# Patient Record
Sex: Female | Born: 1975 | Race: Black or African American | Hispanic: No | Marital: Single | Smoking: Current every day smoker
Health system: Southern US, Community
[De-identification: ages and names within clinical notes are randomized; demographics above are authoritative.]

## PROBLEM LIST (undated history)

## (undated) DIAGNOSIS — I10 Essential (primary) hypertension: Secondary | ICD-10-CM

## (undated) DIAGNOSIS — G43909 Migraine, unspecified, not intractable, without status migrainosus: Secondary | ICD-10-CM

## (undated) HISTORY — PX: LEG SURGERY: SHX1003

---
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
## 2006-01-02 ENCOUNTER — Emergency Department: Payer: Self-pay | Admitting: Emergency Medicine

## 2006-11-02 ENCOUNTER — Emergency Department: Payer: Self-pay | Admitting: Emergency Medicine

## 2007-03-15 ENCOUNTER — Emergency Department: Payer: Self-pay | Admitting: Emergency Medicine

## 2008-01-14 ENCOUNTER — Emergency Department: Payer: Self-pay | Admitting: Emergency Medicine

## 2008-03-06 ENCOUNTER — Emergency Department: Payer: Self-pay | Admitting: Emergency Medicine

## 2010-03-27 ENCOUNTER — Emergency Department: Payer: Self-pay | Admitting: Emergency Medicine

## 2010-08-06 ENCOUNTER — Emergency Department: Payer: Self-pay | Admitting: Emergency Medicine

## 2010-10-21 ENCOUNTER — Emergency Department: Payer: Self-pay | Admitting: Internal Medicine

## 2011-03-05 ENCOUNTER — Emergency Department: Payer: Self-pay | Admitting: Emergency Medicine

## 2011-12-30 ENCOUNTER — Emergency Department: Payer: Self-pay | Admitting: Emergency Medicine

## 2012-01-15 ENCOUNTER — Emergency Department: Payer: Self-pay | Admitting: *Deleted

## 2012-06-22 ENCOUNTER — Emergency Department: Payer: Self-pay | Admitting: Emergency Medicine

## 2012-11-19 ENCOUNTER — Emergency Department: Payer: Self-pay | Admitting: Emergency Medicine

## 2013-05-30 ENCOUNTER — Emergency Department: Payer: Self-pay | Admitting: Emergency Medicine

## 2013-10-24 ENCOUNTER — Emergency Department: Payer: Self-pay | Admitting: Emergency Medicine

## 2013-10-24 LAB — URINALYSIS, COMPLETE
Bacteria: NONE SEEN
Bilirubin,UR: NEGATIVE
Blood: NEGATIVE
GLUCOSE, UR: NEGATIVE mg/dL (ref 0–75)
Leukocyte Esterase: NEGATIVE
NITRITE: NEGATIVE
Ph: 5 (ref 4.5–8.0)
RBC,UR: <1 /HPF (ref 0–5)
Specific Gravity: 1.031 (ref 1.003–1.030)
Squamous Epithelial: 25
WBC UR: 1 /HPF (ref 0–5)

## 2013-10-24 LAB — CBC WITH DIFFERENTIAL/PLATELET
BASOS ABS: 0.1 10*3/uL (ref 0.0–0.1)
Basophil %: 0.5 %
Eosinophil #: 0.1 10*3/uL (ref 0.0–0.7)
Eosinophil %: 1 %
HCT: 39.5 % (ref 35.0–47.0)
HGB: 12.6 g/dL (ref 12.0–16.0)
LYMPHS ABS: 2.7 10*3/uL (ref 1.0–3.6)
LYMPHS PCT: 27.6 %
MCH: 27.6 pg (ref 26.0–34.0)
MCHC: 32.1 g/dL (ref 32.0–36.0)
MCV: 86 fL (ref 80–100)
Monocyte #: 0.7 x10 3/mm (ref 0.2–0.9)
Monocyte %: 7.2 %
NEUTROS ABS: 6.3 10*3/uL (ref 1.4–6.5)
NEUTROS PCT: 63.7 %
Platelet: 242 10*3/uL (ref 150–440)
RBC: 4.58 10*6/uL (ref 3.80–5.20)
RDW: 14.3 % (ref 11.5–14.5)
WBC: 9.9 10*3/uL (ref 3.6–11.0)

## 2013-10-24 LAB — BASIC METABOLIC PANEL
Anion Gap: 1 — ABNORMAL LOW (ref 7–16)
BUN: 12 mg/dL (ref 7–18)
CO2: 30 mmol/L (ref 21–32)
CREATININE: 0.81 mg/dL (ref 0.60–1.30)
Calcium, Total: 8.9 mg/dL (ref 8.5–10.1)
Chloride: 108 mmol/L — ABNORMAL HIGH (ref 98–107)
EGFR (African American): >60
EGFR (Non-African Amer.): >60
GLUCOSE: 75 mg/dL (ref 65–99)
Osmolality: 276 (ref 275–301)
Potassium: 3.5 mmol/L (ref 3.5–5.1)
Sodium: 139 mmol/L (ref 136–145)

## 2013-10-31 ENCOUNTER — Emergency Department: Payer: Self-pay | Admitting: Emergency Medicine

## 2013-10-31 LAB — COMPREHENSIVE METABOLIC PANEL
ANION GAP: 3 — AB (ref 7–16)
AST: 15 U/L (ref 15–37)
Albumin: 3.2 g/dL — ABNORMAL LOW (ref 3.4–5.0)
Alkaline Phosphatase: 107 U/L
BUN: 15 mg/dL (ref 7–18)
Bilirubin,Total: 0.2 mg/dL (ref 0.2–1.0)
CALCIUM: 8 mg/dL — AB (ref 8.5–10.1)
Chloride: 111 mmol/L — ABNORMAL HIGH (ref 98–107)
Co2: 26 mmol/L (ref 21–32)
Creatinine: 0.97 mg/dL (ref 0.60–1.30)
EGFR (African American): >60
EGFR (Non-African Amer.): >60
GLUCOSE: 71 mg/dL (ref 65–99)
Osmolality: 279 (ref 275–301)
POTASSIUM: 3.7 mmol/L (ref 3.5–5.1)
SGPT (ALT): 15 U/L (ref 12–78)
SODIUM: 140 mmol/L (ref 136–145)
Total Protein: 6.8 g/dL (ref 6.4–8.2)

## 2013-10-31 LAB — CBC
HCT: 36.7 % (ref 35.0–47.0)
HGB: 12.4 g/dL (ref 12.0–16.0)
MCH: 28.9 pg (ref 26.0–34.0)
MCHC: 33.8 g/dL (ref 32.0–36.0)
MCV: 86 fL (ref 80–100)
PLATELETS: 194 10*3/uL (ref 150–440)
RBC: 4.29 10*6/uL (ref 3.80–5.20)
RDW: 14.1 % (ref 11.5–14.5)
WBC: 9.4 10*3/uL (ref 3.6–11.0)

## 2013-11-07 ENCOUNTER — Emergency Department: Payer: Self-pay | Admitting: Emergency Medicine

## 2013-11-22 ENCOUNTER — Emergency Department: Payer: Self-pay | Admitting: Emergency Medicine

## 2013-12-01 ENCOUNTER — Emergency Department: Payer: Self-pay | Admitting: Emergency Medicine

## 2013-12-01 LAB — BASIC METABOLIC PANEL
Anion Gap: 3 — ABNORMAL LOW (ref 7–16)
BUN: 11 mg/dL (ref 7–18)
Calcium, Total: 8.2 mg/dL — ABNORMAL LOW (ref 8.5–10.1)
Chloride: 108 mmol/L — ABNORMAL HIGH (ref 98–107)
Co2: 25 mmol/L (ref 21–32)
Creatinine: 0.79 mg/dL (ref 0.60–1.30)
EGFR (Non-African Amer.): >60
GLUCOSE: 145 mg/dL — AB (ref 65–99)
OSMOLALITY: 274 (ref 275–301)
Potassium: 3.5 mmol/L (ref 3.5–5.1)
SODIUM: 136 mmol/L (ref 136–145)

## 2013-12-01 LAB — CBC
HCT: 39.5 % (ref 35.0–47.0)
HGB: 12.9 g/dL (ref 12.0–16.0)
MCH: 28.1 pg (ref 26.0–34.0)
MCHC: 32.7 g/dL (ref 32.0–36.0)
MCV: 86 fL (ref 80–100)
PLATELETS: 209 10*3/uL (ref 150–440)
RBC: 4.6 10*6/uL (ref 3.80–5.20)
RDW: 14.1 % (ref 11.5–14.5)
WBC: 8.5 10*3/uL (ref 3.6–11.0)

## 2013-12-01 LAB — TROPONIN I: Troponin-I: <0.02 ng/mL

## 2014-04-28 ENCOUNTER — Emergency Department: Payer: Self-pay | Admitting: Student

## 2014-05-22 ENCOUNTER — Emergency Department: Payer: Self-pay | Admitting: Emergency Medicine

## 2014-07-15 IMAGING — CR DG CHEST 2V
1 series · 2 of 2 positions shown · non-contrast
Comparison: None.

CLINICAL DATA: Mid chest pain, left arm numbness and shortness of
breath

EXAM:
CHEST  2 VIEW

[Series 1: w chest pa · 0.14mm/px · 2 of 2 slices shown]
[im 1/2]
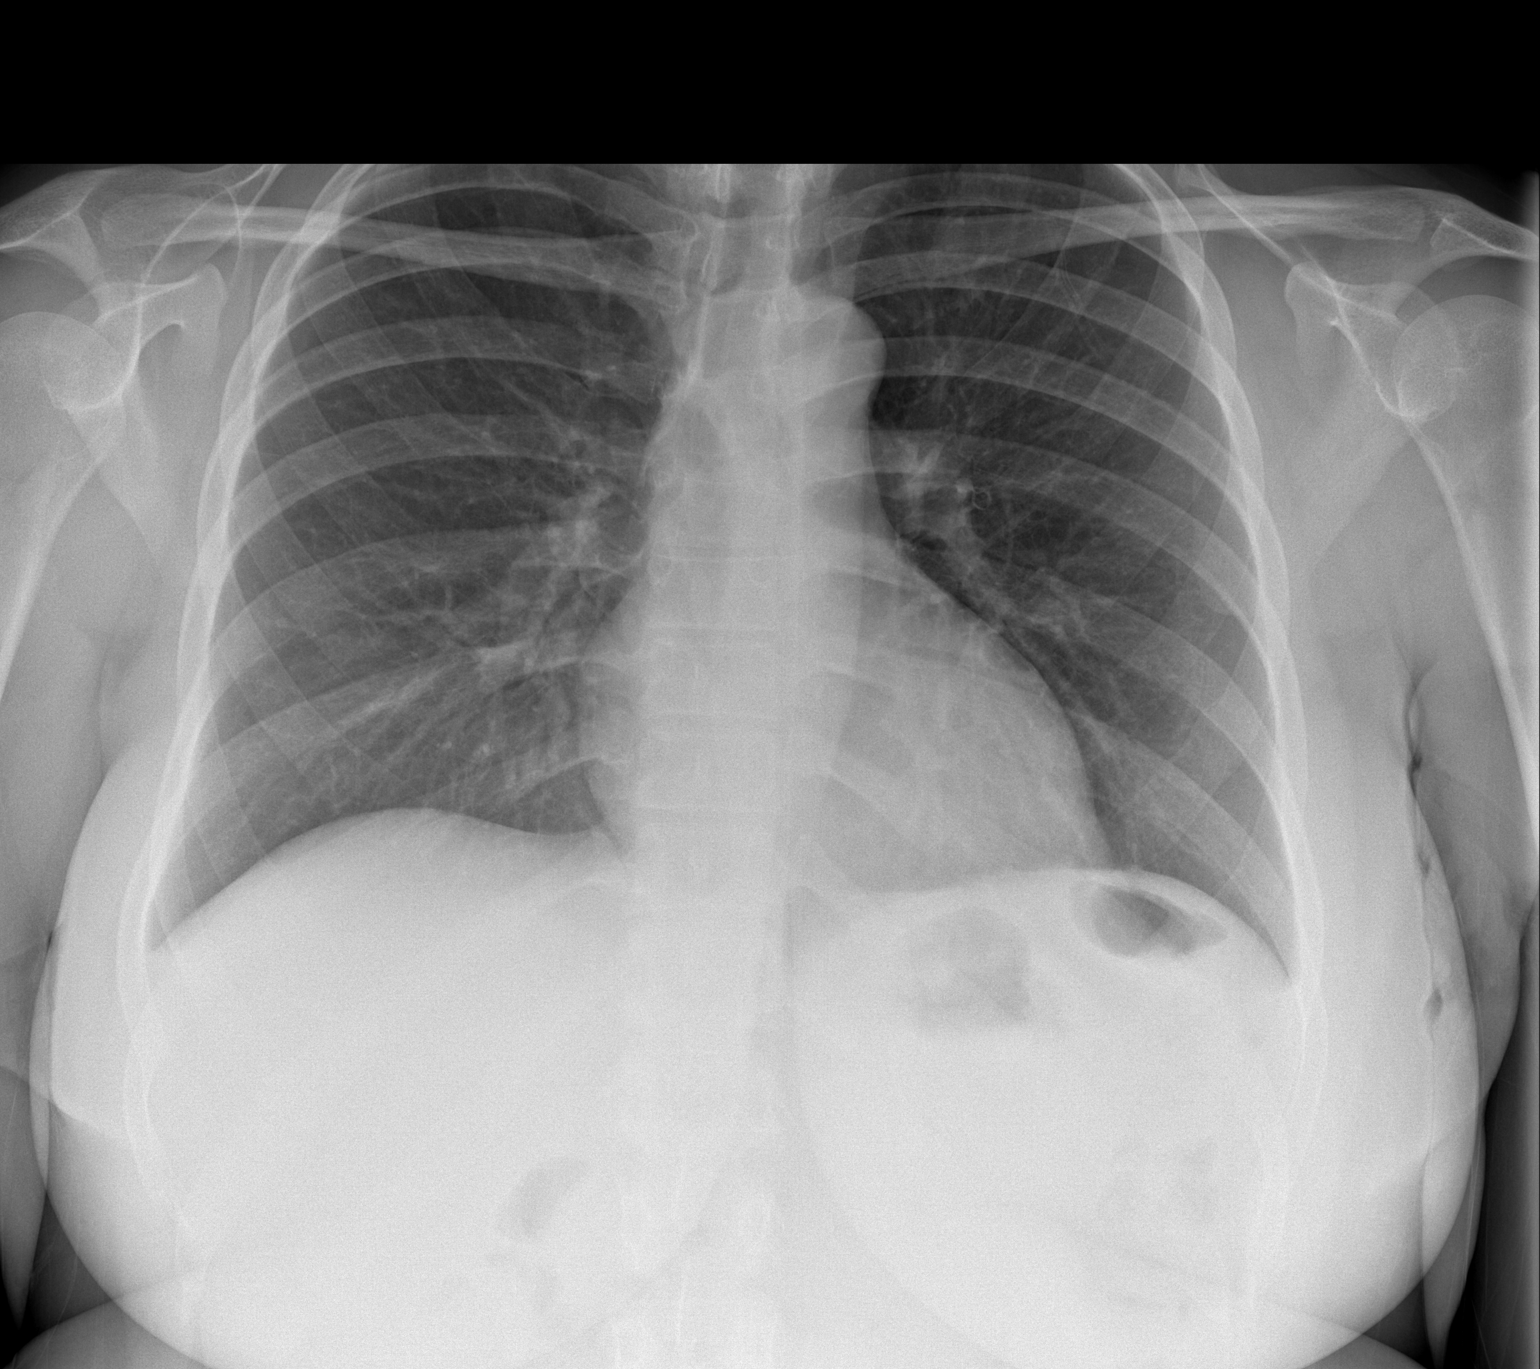
[im 2/2]
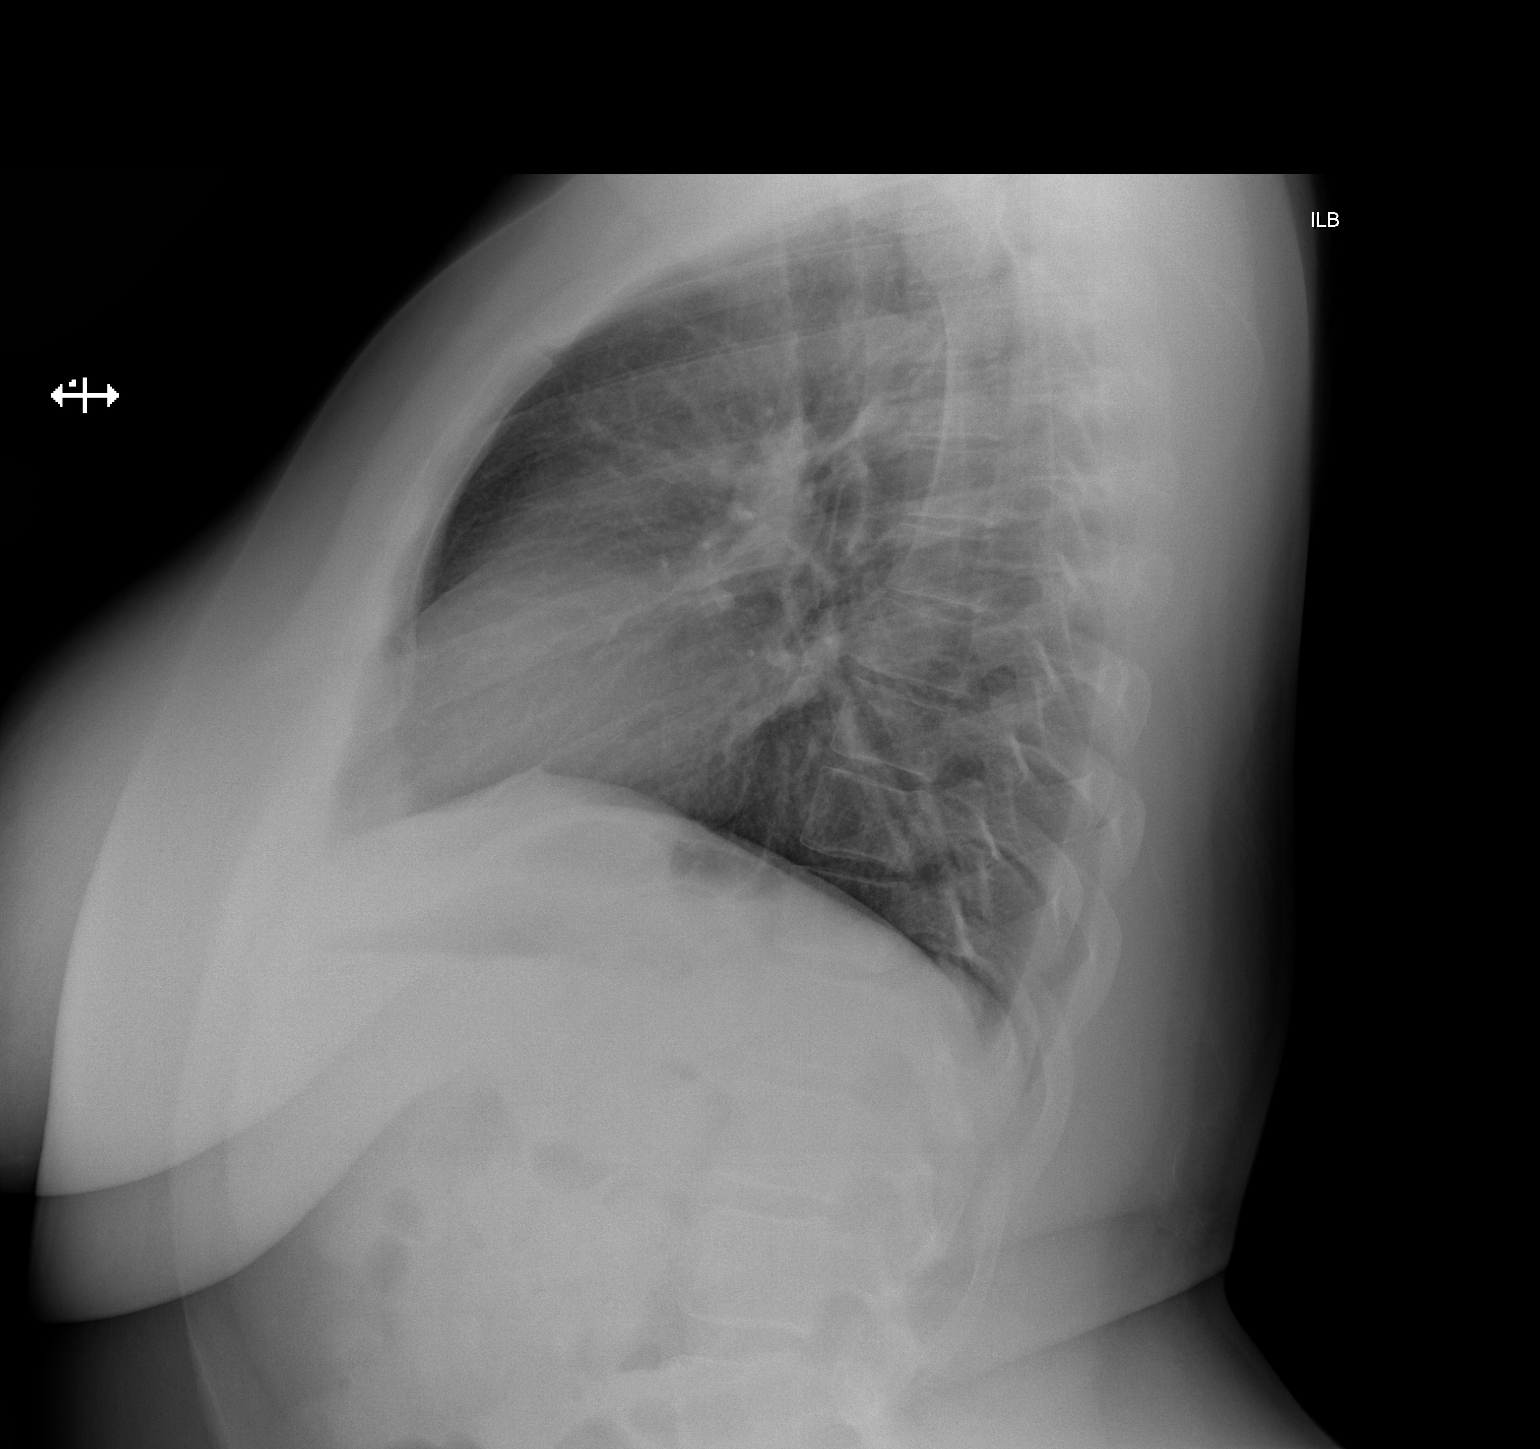

[2 of 2 positions shown; findings below may reference images not displayed]

FINDINGS: The heart size and mediastinal contours are within normal limits.
Both lungs are clear. The visualized skeletal structures are
unremarkable.
IMPRESSION: No active cardiopulmonary disease.

## 2014-11-07 ENCOUNTER — Emergency Department: Payer: Self-pay | Admitting: Emergency Medicine

## 2014-12-15 ENCOUNTER — Emergency Department
Admit: 2014-12-15 | Disposition: A | Discharge status: Home / Self Care | Payer: Self-pay | Admitting: Emergency Medicine

## 2014-12-15 LAB — COMPREHENSIVE METABOLIC PANEL
Albumin: 4.1 g/dL
Alkaline Phosphatase: 98 U/L
Anion Gap: 4 — ABNORMAL LOW (ref 7–16)
BUN: 9 mg/dL
Bilirubin,Total: 0.6 mg/dL
Calcium, Total: 8.8 mg/dL — ABNORMAL LOW
Chloride: 109 mmol/L
Co2: 25 mmol/L
Creatinine: 0.85 mg/dL
EGFR (African American): >60
EGFR (Non-African Amer.): >60
Glucose: 84 mg/dL
Potassium: 3.6 mmol/L
SGOT(AST): 16 U/L
SGPT (ALT): 13 U/L — ABNORMAL LOW
Sodium: 138 mmol/L
Total Protein: 7.5 g/dL

## 2014-12-15 LAB — CBC WITH DIFFERENTIAL/PLATELET
BASOS ABS: 0.1 10*3/uL (ref 0.0–0.1)
Basophil %: 0.6 %
EOS PCT: 0.8 %
Eosinophil #: 0.1 10*3/uL (ref 0.0–0.7)
HCT: 39.1 % (ref 35.0–47.0)
HGB: 13 g/dL (ref 12.0–16.0)
Lymphocyte #: 2.5 10*3/uL (ref 1.0–3.6)
Lymphocyte %: 23.2 %
MCH: 28.4 pg (ref 26.0–34.0)
MCHC: 33.4 g/dL (ref 32.0–36.0)
MCV: 85 fL (ref 80–100)
MONO ABS: 0.8 x10 3/mm (ref 0.2–0.9)
MONOS PCT: 7.7 %
NEUTROS PCT: 67.7 %
Neutrophil #: 7.4 10*3/uL — ABNORMAL HIGH (ref 1.4–6.5)
PLATELETS: 222 10*3/uL (ref 150–440)
RBC: 4.59 10*6/uL (ref 3.80–5.20)
RDW: 14.7 % — ABNORMAL HIGH (ref 11.5–14.5)
WBC: 10.9 10*3/uL (ref 3.6–11.0)

## 2014-12-15 LAB — URINALYSIS, COMPLETE
BILIRUBIN, UR: NEGATIVE
Blood: NEGATIVE
GLUCOSE, UR: NEGATIVE mg/dL (ref 0–75)
Leukocyte Esterase: NEGATIVE
Nitrite: POSITIVE
Ph: 5 (ref 4.5–8.0)
Protein: 30
SPECIFIC GRAVITY: 1.024 (ref 1.003–1.030)

## 2014-12-15 LAB — LIPASE, BLOOD: Lipase: 29 U/L

## 2015-01-19 ENCOUNTER — Emergency Department: Payer: No Typology Code available for payment source

## 2015-01-19 ENCOUNTER — Encounter: Payer: Self-pay | Admitting: Emergency Medicine

## 2015-01-19 DIAGNOSIS — Y9389 Activity, other specified: Secondary | ICD-10-CM | POA: Insufficient documentation

## 2015-01-19 DIAGNOSIS — S0083XA Contusion of other part of head, initial encounter: Secondary | ICD-10-CM | POA: Diagnosis not present

## 2015-01-19 DIAGNOSIS — I10 Essential (primary) hypertension: Secondary | ICD-10-CM | POA: Insufficient documentation

## 2015-01-19 DIAGNOSIS — Y9289 Other specified places as the place of occurrence of the external cause: Secondary | ICD-10-CM | POA: Diagnosis not present

## 2015-01-19 DIAGNOSIS — Z720 Tobacco use: Secondary | ICD-10-CM | POA: Insufficient documentation

## 2015-01-19 DIAGNOSIS — S8992XA Unspecified injury of left lower leg, initial encounter: Secondary | ICD-10-CM | POA: Insufficient documentation

## 2015-01-19 DIAGNOSIS — S60211A Contusion of right wrist, initial encounter: Secondary | ICD-10-CM | POA: Insufficient documentation

## 2015-01-19 DIAGNOSIS — S0990XA Unspecified injury of head, initial encounter: Secondary | ICD-10-CM | POA: Diagnosis present

## 2015-01-19 DIAGNOSIS — Y998 Other external cause status: Secondary | ICD-10-CM | POA: Insufficient documentation

## 2015-01-19 DIAGNOSIS — W01198A Fall on same level from slipping, tripping and stumbling with subsequent striking against other object, initial encounter: Secondary | ICD-10-CM | POA: Insufficient documentation

## 2015-01-19 DIAGNOSIS — Z880 Allergy status to penicillin: Secondary | ICD-10-CM | POA: Insufficient documentation

## 2015-01-19 MED ORDER — OXYCODONE-ACETAMINOPHEN 5-325 MG PO TABS
1.0000 | ORAL_TABLET | Freq: Once | ORAL | Status: AC
  Administered 2015-01-19: 1 via ORAL

## 2015-01-19 MED ORDER — OXYCODONE-ACETAMINOPHEN 5-325 MG PO TABS
ORAL_TABLET | ORAL | Status: AC
  Administered 2015-01-19: 1 via ORAL
  Filled 2015-01-19: qty 1

## 2015-01-19 NOTE — ED Notes (Signed)
Pt presents to ED with c/o pain to head, right arm and hand after falling while getting out of the shower around 1 this afternoon. Pt is tearful during triage.

## 2015-01-20 ENCOUNTER — Emergency Department: Payer: No Typology Code available for payment source

## 2015-01-20 ENCOUNTER — Emergency Department
Admission: EM | Admit: 2015-01-20 | Discharge: 2015-01-20 | Disposition: A | Discharge status: Home / Self Care | Payer: No Typology Code available for payment source | Attending: Emergency Medicine | Admitting: Emergency Medicine

## 2015-01-20 DIAGNOSIS — S60211A Contusion of right wrist, initial encounter: Secondary | ICD-10-CM

## 2015-01-20 DIAGNOSIS — S0990XA Unspecified injury of head, initial encounter: Secondary | ICD-10-CM

## 2015-01-20 HISTORY — DX: Essential (primary) hypertension: I10

## 2015-01-20 HISTORY — DX: Migraine, unspecified, not intractable, without status migrainosus: G43.909

## 2015-01-20 MED ORDER — OXYCODONE-ACETAMINOPHEN 5-325 MG PO TABS
ORAL_TABLET | ORAL | Status: AC
  Administered 2015-01-20: 1 via ORAL
  Filled 2015-01-20: qty 1

## 2015-01-20 MED ORDER — OXYCODONE-ACETAMINOPHEN 5-325 MG PO TABS
1.0000 | ORAL_TABLET | Freq: Once | ORAL | Status: AC
  Administered 2015-01-20: 1 via ORAL

## 2015-01-20 NOTE — Discharge Instructions (Signed)
Head Injury  You have a head injury. Headaches and throwing up (vomiting) are common after a head injury. It should be easy to wake up from sleeping. Sometimes you must stay in the hospital. Most problems happen within the first 24 hours. Side effects may occur up to 7-10 days after the injury.   WHAT ARE THE TYPES OF HEAD INJURIES?  Head injuries can be as minor as a bump. Some head injuries can be more severe. More severe head injuries include:  · A jarring injury to the brain (concussion).  · A bruise of the brain (contusion). This mean there is bleeding in the brain that can cause swelling.  · A cracked skull (skull fracture).  · Bleeding in the brain that collects, clots, and forms a bump (hematoma).  WHEN SHOULD I GET HELP RIGHT AWAY?   · You are confused or sleepy.  · You cannot be woken up.  · You feel sick to your stomach (nauseous) or keep throwing up (vomiting).  · Your dizziness or unsteadiness is getting worse.  · You have very bad, lasting headaches that are not helped by medicine. Take medicines only as told by your doctor.  · You cannot use your arms or legs like normal.  · You cannot walk.  · You notice changes in the black spots in the center of the colored part of your eye (pupil).  · You have clear or bloody fluid coming from your nose or ears.  · You have trouble seeing.  During the next 24 hours after the injury, you must stay with someone who can watch you. This person should get help right away (call 911 in the [place] if you start to shake and are not able to control it (have seizures), you pass out, or you are unable to wake up.  HOW CAN I PREVENT A HEAD INJURY IN THE FUTURE?  · Wear seat belts.  · Wear a helmet while bike riding and playing sports like football.  · Stay away from dangerous activities around the house.  WHEN CAN I RETURN TO NORMAL ACTIVITIES AND ATHLETICS?  See your doctor before doing these activities. You should not do normal activities or play contact sports until 1 week  after the following symptoms have stopped:  · Headache that does not go away.  · Dizziness.  · Poor attention.  · Confusion.  · Memory problems.  · Sickness to your stomach or throwing up.  · Tiredness.  · Fussiness.  · Bothered by bright lights or loud noises.  · Anxiousness or depression.  · Restless sleep.  MAKE SURE YOU:   · Understand these instructions.  · Will watch your condition.  · Will get help right away if you are not doing well or get worse.  Document Released: 07/24/2008 Document Revised: 12/26/2013 Document Reviewed: 04/18/2013  ExitCare® Patient Information ©2015 [employer]. This information is not intended to replace advice given to you by your health care provider. Make sure you discuss any questions you have with your health care provider.

## 2015-01-20 NOTE — ED Provider Notes (Signed)
[facility] Emergency Department Provider Note  ____________________________________________  Time seen: Approximately 2:20 AM  I have reviewed the triage vital signs and the nursing notes.   HISTORY  Chief Complaint Fall    HPI Molly Fernandez is a 39 y.o. female presents after falling while getting out of the shower at 1 PM yesterday. Patient notes she was getting out of the shower and caught her leg she fell forward and hit the left side of her face as well as left shoulder and right wrist. Since then she has been having a moderate left-sided headache, as well as pain over the left collarbone, and tenderness in the right hand.  She denies any neck pain. She is unsure if she could have lost consciousness, but is unclear. She did not have a seizure. She denies any chest pain or shortness of breath. She states that she simply tripped, and has been having pain and soreness as described above. She is not pregnant.   Past Medical History  Diagnosis Date  . Hypertension   . Migraine     There are no active problems to display for this patient.   Past Surgical History  Procedure Laterality Date  . Leg surgery Right     No current outpatient prescriptions on file.  Allergies Penicillins  No family history on file.  Social History History  Substance Use Topics  . Smoking status: Current Every Day Smoker  . Smokeless tobacco: Not on file  . Alcohol Use: No    Review of Systems Constitutional: No fever/chills Eyes: No visual changes. ENT: No sore throat. Cardiovascular: Denies chest pain. Respiratory: Denies shortness of breath. Gastrointestinal: No abdominal pain.  No nausea, no vomiting.  No diarrhea.  No constipation. Genitourinary: Negative for dysuria. Musculoskeletal: Negative for back pain. Skin: Negative for rash. Neurological: See history of present illness. No focal weakness or numbness  10-point ROS otherwise  negative.  ____________________________________________   PHYSICAL EXAM:  VITAL SIGNS: ED Triage Vitals  Enc Vitals Group     BP 01/19/15 2016 169/125 mmHg     Pulse Rate 01/19/15 2016 91     Resp 01/19/15 2016 18     Temp 01/19/15 2016 98.3 F (36.8 C)     Temp Source 01/19/15 2016 Oral     SpO2 01/19/15 2016 100 %     Weight 01/19/15 2016 215 lb (97.523 kg)     Height 01/19/15 2016  (1.6 m)     Head Cir --      Peak Flow --      Pain Score 01/19/15 2018 10     Pain Loc --      Pain Edu? --      Excl. in GC? --     Constitutional: Alert and oriented. Well appearing and in no acute distress. Eyes: Conjunctivae are normal. PERRL. EOMI. Head: Atraumatic except for a small amount of hematoma over the left fourth head. Nose: No congestion/rhinnorhea. Mouth/Throat: Mucous membranes are moist.  Oropharynx non-erythematous. Neck: No stridor.  No cervical spine tenderness to palpation. Cardiovascular: Normal rate, regular rhythm. Grossly normal heart sounds.  Good peripheral circulation. Respiratory: Normal respiratory effort.  No retractions. Lungs CTAB. Gastrointestinal: Soft and nontender. No distention. No abdominal bruits. No CVA tenderness. Musculoskeletal: No lower extremity tenderness nor edema.  No joint effusions. There is mild tenderness over the right lateral wrist, without evidence of effusion or deformity. There is also minimal tenderness over the right fifth metacarpal with very  minimal contusion, but no deformity. Median ulnar and radial nerves are neurosensory intact. Normal circulation to the right hand. Left upper extremity is atraumatic, there is minimal tenderness over the left collarbone but no obvious step-off or deformity. Remainder of the chest is nontender. Neurologic:  Normal speech and language. No gross focal neurologic deficits are appreciated. Speech is normal. No gait instability. Skin:  Skin is warm, dry and intact. No rash noted. Psychiatric: Mood  and affect are normal. Speech and behavior are normal.  ____________________________________________   LABS (all labs ordered are listed, but only abnormal results are displayed)  Labs Reviewed - No data to display ____________________________________________  EKG  DG Chest 2 View (Final result) Result time: 01/20/15 01:47:54   Final result by Rad Results In Interface (01/20/15 01:47:54)   Narrative:   CLINICAL DATA: Status post fall in shower, with left anterior upper chest pain. Initial encounter.  EXAM: CHEST 2 VIEW  COMPARISON: Chest radiograph from 12/01/2013  FINDINGS: The lungs are well-aerated and clear. There is no evidence of focal opacification, pleural effusion or pneumothorax.  The heart is normal in size; the mediastinal contour is within normal limits. No acute osseous abnormalities are seen.  IMPRESSION: No acute cardiopulmonary process seen. No displaced rib fractures identified.   Electronically Signed By: Roanna Raider M.D. On: 01/20/2015 01:47          DG Hand Complete Right (Final result) Result time: 01/20/15 01:48:46   Final result by Rad Results In Interface (01/20/15 01:48:46)   Narrative:   CLINICAL DATA: Status post fall in shower, with right hand pain, particularly about the fifth metacarpal. Initial encounter.  EXAM: RIGHT HAND - COMPLETE 3+ VIEW  COMPARISON: Right hand radiographs performed 03/15/2007  FINDINGS: There is no evidence of fracture or dislocation. The joint spaces are preserved. The carpal rows are intact, and demonstrate normal alignment. The soft tissues are unremarkable in appearance.  IMPRESSION: No evidence of fracture or dislocation.   Electronically Signed By: Roanna Raider M.D. On: 01/20/2015 01:48          CT Head Wo Contrast (Final result) Result time: 01/19/15 20:59:02   Final result by Rad Results In Interface (01/19/15 20:59:02)   Narrative:   CLINICAL DATA: Pt  presents to ED with c/o pain to head, right arm and hand after falling while getting out of the shower around 1 this afternoon.  EXAM: CT HEAD WITHOUT CONTRAST  TECHNIQUE: Contiguous axial images were obtained from the base of the skull through the vertex without intravenous contrast.  COMPARISON: 11/19/2012  FINDINGS: Ventricles normal in size and configuration.  No parenchymal masses or mass effect. No evidence of an infarct.  There are no extra-axial masses or abnormal fluid collections.  There is no intracranial hemorrhage.  No skull fracture. Visualized sinuses and mastoid air cells are clear.  IMPRESSION: Normal unenhanced CT scan of the brain.   Electronically Signed By: Amie Portland M.D. On: 01/19/2015 20:59          DG Wrist Complete Right (Final result) Result time: 01/19/15 20:56:42   Final result by Rad Results In Interface (01/19/15 20:56:42)   Narrative:   CLINICAL DATA: C/o pain to medial Rt wrist up to 5th metacarpal after falling out of shower today. Pt states no prior hx of issues  EXAM: RIGHT WRIST - COMPLETE 3+ VIEW  COMPARISON: None.  FINDINGS: There is no evidence of fracture or dislocation. There is no evidence of arthropathy or other focal bone abnormality. Soft tissues are  unremarkable.  IMPRESSION: Negative.   Electronically Signed By: Amie Portlandavid Ormond M.D. On: 01/19/2015 20:56    ____________________________________________   PROCEDURES  Procedure(s) performed: None  Critical Care performed: No  ____________________________________________   INITIAL IMPRESSION / ASSESSMENT AND PLAN / ED COURSE  Pertinent labs & imaging results that were available during my care of the patient were reviewed by me and considered in my medical decision making (see chart for details).  Mechanical fall. Multiple injuries, however none appear to be of emergent concern. She does have evidence of a small hematoma on the  forehead as well as contusions over the right wrist and hand.  We will treat the patient with NSAID medications for pain over-the-counter and advised follow-up with her PCP.  Return precautions, specifically head injury instructions discussed with the patient. Her friend is driving her home. ____________________________________________   FINAL CLINICAL IMPRESSION(S) / ED DIAGNOSES  Final diagnoses:  Closed head injury, initial encounter  Contusion of right wrist, initial encounter       Sharyn CreamerMark Callaghan Laverdure, MD 01/25/15 [phone]

## 2015-02-19 ENCOUNTER — Encounter: Payer: Self-pay | Admitting: General Practice

## 2015-02-19 ENCOUNTER — Emergency Department
Admission: EM | Admit: 2015-02-19 | Discharge: 2015-02-19 | Disposition: A | Discharge status: Home / Self Care | Payer: No Typology Code available for payment source | Attending: Emergency Medicine | Admitting: Emergency Medicine

## 2015-02-19 DIAGNOSIS — Z720 Tobacco use: Secondary | ICD-10-CM | POA: Insufficient documentation

## 2015-02-19 DIAGNOSIS — Y9389 Activity, other specified: Secondary | ICD-10-CM | POA: Insufficient documentation

## 2015-02-19 DIAGNOSIS — W01198A Fall on same level from slipping, tripping and stumbling with subsequent striking against other object, initial encounter: Secondary | ICD-10-CM | POA: Diagnosis not present

## 2015-02-19 DIAGNOSIS — R51 Headache: Secondary | ICD-10-CM | POA: Diagnosis not present

## 2015-02-19 DIAGNOSIS — I10 Essential (primary) hypertension: Secondary | ICD-10-CM | POA: Insufficient documentation

## 2015-02-19 DIAGNOSIS — Y998 Other external cause status: Secondary | ICD-10-CM | POA: Insufficient documentation

## 2015-02-19 DIAGNOSIS — R519 Headache, unspecified: Secondary | ICD-10-CM

## 2015-02-19 DIAGNOSIS — G8929 Other chronic pain: Secondary | ICD-10-CM | POA: Diagnosis not present

## 2015-02-19 DIAGNOSIS — S0990XA Unspecified injury of head, initial encounter: Secondary | ICD-10-CM | POA: Diagnosis present

## 2015-02-19 DIAGNOSIS — Y9289 Other specified places as the place of occurrence of the external cause: Secondary | ICD-10-CM | POA: Diagnosis not present

## 2015-02-19 MED ORDER — KETOROLAC TROMETHAMINE 30 MG/ML IJ SOLN
INTRAMUSCULAR | Status: AC
  Filled 2015-02-19: qty 1

## 2015-02-19 MED ORDER — SODIUM CHLORIDE 0.9 % IV SOLN
Freq: Once | INTRAVENOUS | Status: AC
  Administered 2015-02-19: 22:00:00 via INTRAVENOUS

## 2015-02-19 MED ORDER — KETOROLAC TROMETHAMINE 30 MG/ML IJ SOLN
30.0000 mg | Freq: Once | INTRAMUSCULAR | Status: AC
  Administered 2015-02-19: 30 mg via INTRAVENOUS

## 2015-02-19 MED ORDER — METOCLOPRAMIDE HCL 10 MG PO TABS: 10.0000 mg | ORAL_TABLET | Freq: Three times a day (TID) | ORAL | Status: DC | PRN

## 2015-02-19 MED ORDER — DIPHENHYDRAMINE HCL 50 MG/ML IJ SOLN
25.0000 mg | Freq: Once | INTRAMUSCULAR | Status: AC
  Administered 2015-02-19: 25 mg via INTRAVENOUS

## 2015-02-19 MED ORDER — DIPHENHYDRAMINE HCL 50 MG/ML IJ SOLN
INTRAMUSCULAR | Status: AC
  Filled 2015-02-19: qty 1

## 2015-02-19 MED ORDER — METOCLOPRAMIDE HCL 5 MG/ML IJ SOLN
10.0000 mg | Freq: Once | INTRAMUSCULAR | Status: AC
  Administered 2015-02-19: 10 mg via INTRAVENOUS
  Filled 2015-02-19: qty 2

## 2015-02-19 NOTE — Discharge Instructions (Signed)

## 2015-02-19 NOTE — ED Notes (Signed)
Pt. Arrived to ed from home with reports of experiencing headache over the last few weeks. Pt verbalized hx of migraines, but states "i fell and hit my head on the tiolet two weeks ago and i have had this headache since". PT alert and oriented. Denies LOC. Pt verbalized hx of HTN. Pt states "i mean i usually have headaches but they usually start at the base of my head not right here" as pt points to left forehead.

## 2015-02-19 NOTE — ED Provider Notes (Signed)
[facility] Emergency Department Provider Note     Time seen: ----------------------------------------- 8:38 PM on 02/19/2015 -----------------------------------------    I have reviewed the triage vital signs and the nursing notes.   HISTORY  Chief Complaint Headache    HPI Molly Fernandez is a 39 y.o. female who presents ER for worsening headache intermittently over the last month. Patient states she doesn't history of migraines who states she fell and hit her head on the toilet 2 weeks ago and she's had significant headache since. Initial trauma was to the left frontal scalp, headache is across the frontal scalp bilaterally. Denies any trouble with light or sound. Patient states she used to take Maxalt, but couldn't afford to take it anymore. She typically just takes Excedrin Migraine for headaches, but these aren't really helping that much.Headache is severe this time.   Past Medical History  Diagnosis Date  . Hypertension   . Migraine     There are no active problems to display for this patient.   Past Surgical History  Procedure Laterality Date  . Leg surgery Right     Allergies Penicillins  Social History History  Substance Use Topics  . Smoking status: Current Every Day Smoker -- 0.50 packs/day    Types: Cigarettes  . Smokeless tobacco: Not on file  . Alcohol Use: No    Review of Systems Constitutional: Negative for fever. Eyes: Negative for visual changes. ENT: Negative for sore throat. Cardiovascular: Negative for chest pain. Respiratory: Negative for shortness of breath. Gastrointestinal: Negative for abdominal pain, vomiting and diarrhea. Genitourinary: Negative for dysuria. Musculoskeletal: Negative for back pain. Skin: Negative for rash. Neurological: Positive for headache  10-point ROS otherwise negative.  ____________________________________________   PHYSICAL EXAM:  VITAL SIGNS: ED Triage Vitals  Enc  Vitals Group     BP 02/19/15 1838 171/101 mmHg     Pulse Rate 02/19/15 1836 73     Resp 02/19/15 1836 20     Temp 02/19/15 1836 98.3 F (36.8 C)     Temp Source 02/19/15 1836 Oral     SpO2 02/19/15 1836 98 %     Weight 02/19/15 1836 200 lb (90.719 kg)     Height 02/19/15 1836 5\' 3"  (1.6 m)     Head Cir --      Peak Flow --      Pain Score 02/19/15 1842 8     Pain Loc --      Pain Edu? --      Excl. in GC? --     Constitutional: Alert and oriented. Well appearing and in no distress. Eyes: Conjunctivae are normal. PERRL. Normal extraocular movements. ENT   Head: Normocephalic and atraumatic.   Nose: No congestion/rhinnorhea.   Mouth/Throat: Mucous membranes are moist.   Neck: No stridor. Hematological/Lymphatic/Immunilogical: No cervical lymphadenopathy. Cardiovascular: Normal rate, regular rhythm. Normal and symmetric distal pulses are present in all extremities. No murmurs, rubs, or gallops. Respiratory: Normal respiratory effort without tachypnea nor retractions. Breath sounds are clear and equal bilaterally. No wheezes/rales/rhonchi. Gastrointestinal: Soft and nontender. No distention. No abdominal bruits. There is no CVA tenderness. Musculoskeletal: Nontender with normal range of motion in all extremities. No joint effusions.  No lower extremity tenderness nor edema. Neurologic:  Normal speech and language. No gross focal neurologic deficits are appreciated. Speech is normal. No gait instability. Skin:  Skin is warm, dry and intact. No rash noted. Psychiatric: Mood and affect are normal. Speech and behavior are normal. Patient exhibits appropriate  insight and judgment. ____________________________________________  ED COURSE:  Pertinent labs & imaging results that were available during my care of the patient were reviewed by me and considered in my medical decision making (see chart for details). Patient is in no acute distress, will receive IV fluids, IV Reglan,  Toradol, Benadryl. We will reevaluate after that point ____________________________________________  FINAL ASSESSMENT AND PLAN  Headache generalized, unknown cause  Plan: Patient notes she's been under increased stress. Doubt the head trauma has anything to with her headache tonight. She is feeling better after the IV medications dictated above, stable for outpatient follow-up.   Emily Filbert, MD   Emily Filbert, MD 02/20/15 [phone]

## 2015-02-26 ENCOUNTER — Encounter: Payer: Self-pay | Admitting: Emergency Medicine

## 2015-02-26 ENCOUNTER — Other Ambulatory Visit: Payer: Self-pay

## 2015-02-26 ENCOUNTER — Emergency Department
Admission: EM | Admit: 2015-02-26 | Discharge: 2015-02-26 | Disposition: A | Discharge status: Home / Self Care | Payer: No Typology Code available for payment source | Attending: Emergency Medicine | Admitting: Emergency Medicine

## 2015-02-26 DIAGNOSIS — Z79899 Other long term (current) drug therapy: Secondary | ICD-10-CM | POA: Insufficient documentation

## 2015-02-26 DIAGNOSIS — Z8669 Personal history of other diseases of the nervous system and sense organs: Secondary | ICD-10-CM | POA: Insufficient documentation

## 2015-02-26 DIAGNOSIS — Z720 Tobacco use: Secondary | ICD-10-CM | POA: Insufficient documentation

## 2015-02-26 DIAGNOSIS — R55 Syncope and collapse: Secondary | ICD-10-CM | POA: Diagnosis not present

## 2015-02-26 DIAGNOSIS — Z3202 Encounter for pregnancy test, result negative: Secondary | ICD-10-CM | POA: Insufficient documentation

## 2015-02-26 DIAGNOSIS — R51 Headache: Secondary | ICD-10-CM | POA: Diagnosis not present

## 2015-02-26 DIAGNOSIS — I10 Essential (primary) hypertension: Secondary | ICD-10-CM | POA: Diagnosis not present

## 2015-02-26 LAB — BASIC METABOLIC PANEL
ANION GAP: 7 (ref 5–15)
BUN: 11 mg/dL (ref 6–20)
CALCIUM: 8.8 mg/dL — AB (ref 8.9–10.3)
CHLORIDE: 104 mmol/L (ref 101–111)
CO2: 26 mmol/L (ref 22–32)
CREATININE: 0.98 mg/dL (ref 0.44–1.00)
GFR calc Af Amer: >60 mL/min (ref >60)
GFR calc non Af Amer: >60 mL/min (ref >60)
Glucose, Bld: 99 mg/dL (ref 65–99)
Potassium: 3.1 mmol/L — ABNORMAL LOW (ref 3.5–5.1)
Sodium: 137 mmol/L (ref 135–145)

## 2015-02-26 LAB — URINALYSIS COMPLETE WITH MICROSCOPIC (ARMC ONLY)
BACTERIA UA: NONE SEEN
BILIRUBIN URINE: NEGATIVE
Glucose, UA: NEGATIVE mg/dL
Hgb urine dipstick: NEGATIVE
KETONES UR: NEGATIVE mg/dL
Leukocytes, UA: NEGATIVE
Nitrite: NEGATIVE
PH: 5 (ref 5.0–8.0)
PROTEIN: NEGATIVE mg/dL
Specific Gravity, Urine: 1.016 (ref 1.005–1.030)

## 2015-02-26 LAB — CBC
HEMATOCRIT: 38.3 % (ref 35.0–47.0)
HEMOGLOBIN: 12.4 g/dL (ref 12.0–16.0)
MCH: 27.6 pg (ref 26.0–34.0)
MCHC: 32.3 g/dL (ref 32.0–36.0)
MCV: 85.4 fL (ref 80.0–100.0)
Platelets: 220 10*3/uL (ref 150–440)
RBC: 4.48 MIL/uL (ref 3.80–5.20)
RDW: 14.2 % (ref 11.5–14.5)
WBC: 9.1 10*3/uL (ref 3.6–11.0)

## 2015-02-26 LAB — SEDIMENTATION RATE: Sed Rate: 14 mm/hr (ref 0–20)

## 2015-02-26 LAB — POCT PREGNANCY, URINE: PREG TEST UR: NEGATIVE

## 2015-02-26 MED ORDER — TRAMADOL HCL 50 MG PO TABS
100.0000 mg | ORAL_TABLET | Freq: Once | ORAL | Status: AC
  Administered 2015-02-26: 100 mg via ORAL

## 2015-02-26 MED ORDER — TRAMADOL HCL 50 MG PO TABS
ORAL_TABLET | ORAL | Status: AC
  Administered 2015-02-26: 100 mg via ORAL
  Filled 2015-02-26: qty 2

## 2015-02-26 NOTE — Discharge Instructions (Signed)
Near-Syncope Near-syncope (commonly known as near fainting) is sudden weakness, dizziness, or feeling like you might pass out. During an episode of near-syncope, you may also develop pale skin, have tunnel vision, or feel sick to your stomach (nauseous). Near-syncope may occur when getting up after sitting or while standing for a long time. It is caused by a sudden decrease in blood flow to the brain. This decrease can result from various causes or triggers, most of which are not serious. However, because near-syncope can sometimes be a sign of something serious, a medical evaluation is required. The specific cause is often not determined. HOME CARE INSTRUCTIONS  Monitor your condition for any changes. The following actions may help to alleviate any discomfort you are experiencing:  Have someone stay with you until you feel stable.  Lie down right away and prop your feet up if you start feeling like you might faint. Breathe deeply and steadily. Wait until all the symptoms have passed. Most of these episodes last only a few minutes. You may feel tired for several hours.   Drink enough fluids to keep your urine clear or pale yellow.   If you are taking blood pressure or heart medicine, get up slowly when seated or lying down. Take several minutes to sit and then stand. This can reduce dizziness.  Follow up with your health care provider as directed. SEEK IMMEDIATE MEDICAL CARE IF:   You have a severe headache.   You have unusual pain in the chest, abdomen, or back.   You are bleeding from the mouth or rectum, or you have black or tarry stool.   You have an irregular or very fast heartbeat.   You have repeated fainting or have seizure-like jerking during an episode.   You faint when sitting or lying down.   You have confusion.   You have difficulty walking.   You have severe weakness.   You have vision problems.  MAKE SURE YOU:   Understand these instructions.  Will  watch your condition.  Will get help right away if you are not doing well or get worse. Document Released: 08/11/2005 Document Revised: 08/16/2013 Document Reviewed: 01/14/2013 ExitCare Patient Information 2015 [employer]. This information is not intended to replace advice given to you by your health care provider. Make sure you discuss any questions you have with your health care provider.  

## 2015-02-26 NOTE — ED Notes (Signed)
Pt ambulatory with a steady gait, complaining of headache 9/10 x1 week, dizzy spells every time upon standing.

## 2015-02-26 NOTE — ED Notes (Signed)
Pt states she had a near syncopal episode at work today. States she took her bp before going to work this am 171/120, took her enalapril/hctz, and has felt dizzy/nauseated ever since. Also c/o ha/neck stiffness.

## 2015-02-26 NOTE — ED Provider Notes (Signed)
[facility] Emergency Department Provider Note  Time seen: 2:25 PM  I have reviewed the triage vital signs and the nursing notes.   HISTORY  Chief Complaint Near Syncope    HPI Molly Fernandez is a [age] y.o. female with a past medical history of hypertension and migraines presents the emergency department with weakness/dizziness. According to the patient she woke up around 5:00 this morning, during showering she felt a little weak and dizzy so she took her blood pressure and it was 170/110. She states she got ready for work and went to work around 7:30 began feeling weak and dizzy again along with a mild headache. She states her symptoms progressively worsened throughout the day at times she felt like she was going to pass out so she decided to come to the emergency department for evaluation. Patient denies any chest pain, abdominal pain, shortness of breath, nausea. Patient does state a headache denies any focal weakness or numbness. Patient describes her symptoms as moderate in severity, worse when she was exerting herself.Somewhat better when resting.     Past Medical History  Diagnosis Date  . Hypertension   . Migraine   . Migraine     There are no active problems to display for this patient.   Past Surgical History  Procedure Laterality Date  . Leg surgery Right     Current Outpatient Rx  Name  Route  Sig  Dispense  Refill  . metoCLOPramide (REGLAN) 10 MG tablet   Oral   Take 1 tablet (10 mg total) by mouth every 8 (eight) hours as needed for nausea or vomiting.   30 tablet   1     Allergies Penicillins  No family history on file.  Social History History  Substance Use Topics  . Smoking status: Current Every Day Smoker -- 0.50 packs/day    Types: Cigarettes  . Smokeless tobacco: Not on file  . Alcohol Use: No    Review of Systems Constitutional: Negative for fever. Positive for Near syncope. Cardiovascular: Negative for chest  pain. Respiratory: Negative for shortness of breath. Gastrointestinal: Negative for abdominal pain, vomiting and diarrhea. Genitourinary: Negative for dysuria. Musculoskeletal: Negative for back pain. Neurological: Positive for headache. Negative for focal weakness or numbness. 10-point ROS otherwise negative.  ____________________________________________   PHYSICAL EXAM:  VITAL SIGNS: ED Triage Vitals  Enc Vitals Group     BP 02/26/15 1234 155/96 mmHg     Pulse Rate 02/26/15 1234 90     Resp 02/26/15 1234 18     Temp 02/26/15 1234 98.5 F (36.9 C)     Temp Source 02/26/15 1234 Oral     SpO2 02/26/15 1234 100 %     Weight 02/26/15 1234 185 lb (83.915 kg)     Height 02/26/15 1234 5\' 3"  (1.6 m)     Head Cir --      Peak Flow --      Pain Score 02/26/15 1235 9     Pain Loc --      Pain Edu? --      Excl. in GC? --     Constitutional: Alert and oriented. Well appearing and in no distress. Eyes: Normal exam ENT   Mouth/Throat: Mucous membranes are moist. Cardiovascular: Normal rate, regular rhythm. No murmur Respiratory: Normal respiratory effort without tachypnea nor retractions. Breath sounds are clear and equal bilaterally. No wheezes/rales/rhonchi. Gastrointestinal: Soft and nontender. No distention.   Musculoskeletal: Nontender with normal range of motion in all  extremities. No lower extremity tenderness or edema. Neurologic:  Normal speech and language. No gross focal neurologic deficits are appreciated. Speech is normal. Skin:  Skin is warm, dry and intact.  Psychiatric: Mood and affect are normal. Speech and behavior are normal.  ____________________________________________    EKG  EKG reviewed and interpreted by myself shows sinus arrhythmia at 70 bpm, narrow QRS, normal axis, normal intervals, nonspecific but no concerning ST changes noted.  ____________________________________________    INITIAL IMPRESSION / ASSESSMENT AND PLAN / ED COURSE  Pertinent  labs & imaging results that were available during my care of the patient were reviewed by me and considered in my medical decision making (see chart for details).  Patient with generalized weakness today, and occasional near syncope feeling. Denies any syncope, chest pain, shortness breath, abdominal pain. Labs are largely within normal limits, we will check an EKG, as well as a urinalysis and pregnancy test. Patient states she feels much better at this time.  Labs including urinalysis and pregnancy tests are within normal limits/negative. EKG is within normal limits. We will discharge the patient home with primary care follow-up. I discussed with the patient plenty of by mouth fluids and rest. Patient agreeable to plan.  ____________________________________________   FINAL CLINICAL IMPRESSION(S) / ED DIAGNOSES  Weakness/dizziness Near-syncope   Minna Antis, MD 02/26/15 1601

## 2015-03-24 ENCOUNTER — Emergency Department
Admission: EM | Admit: 2015-03-24 | Discharge: 2015-03-24 | Disposition: A | Discharge status: Home / Self Care | Payer: No Typology Code available for payment source | Attending: Emergency Medicine | Admitting: Emergency Medicine

## 2015-03-24 ENCOUNTER — Encounter: Payer: Self-pay | Admitting: Emergency Medicine

## 2015-03-24 DIAGNOSIS — Z880 Allergy status to penicillin: Secondary | ICD-10-CM | POA: Insufficient documentation

## 2015-03-24 DIAGNOSIS — I10 Essential (primary) hypertension: Secondary | ICD-10-CM | POA: Insufficient documentation

## 2015-03-24 DIAGNOSIS — N760 Acute vaginitis: Secondary | ICD-10-CM | POA: Diagnosis not present

## 2015-03-24 DIAGNOSIS — Z3202 Encounter for pregnancy test, result negative: Secondary | ICD-10-CM | POA: Insufficient documentation

## 2015-03-24 DIAGNOSIS — Z720 Tobacco use: Secondary | ICD-10-CM | POA: Insufficient documentation

## 2015-03-24 DIAGNOSIS — R102 Pelvic and perineal pain: Secondary | ICD-10-CM | POA: Diagnosis present

## 2015-03-24 DIAGNOSIS — B9689 Other specified bacterial agents as the cause of diseases classified elsewhere: Secondary | ICD-10-CM

## 2015-03-24 LAB — URINALYSIS COMPLETE WITH MICROSCOPIC (ARMC ONLY)
Bilirubin Urine: NEGATIVE
GLUCOSE, UA: NEGATIVE mg/dL
Hgb urine dipstick: NEGATIVE
Ketones, ur: NEGATIVE mg/dL
LEUKOCYTES UA: NEGATIVE
Nitrite: NEGATIVE
Protein, ur: NEGATIVE mg/dL
Specific Gravity, Urine: 1.024 (ref 1.005–1.030)
pH: 5 (ref 5.0–8.0)

## 2015-03-24 LAB — CHLAMYDIA/NGC RT PCR (ARMC ONLY)
CHLAMYDIA TR: NOT DETECTED
N GONORRHOEAE: NOT DETECTED

## 2015-03-24 LAB — PREGNANCY, URINE: Preg Test, Ur: NEGATIVE

## 2015-03-24 LAB — WET PREP, GENITAL
Trich, Wet Prep: NONE SEEN
Yeast Wet Prep HPF POC: NONE SEEN

## 2015-03-24 MED ORDER — ONDANSETRON 8 MG PO TBDP: 8.0000 mg | ORAL_TABLET | Freq: Three times a day (TID) | ORAL | Status: DC | PRN

## 2015-03-24 MED ORDER — METRONIDAZOLE 500 MG PO TABS: 500.0000 mg | ORAL_TABLET | Freq: Two times a day (BID) | ORAL | Status: DC

## 2015-03-24 MED ORDER — NITROFURANTOIN MACROCRYSTAL 100 MG PO CAPS: 100.0000 mg | ORAL_CAPSULE | Freq: Two times a day (BID) | ORAL | Status: DC

## 2015-03-24 NOTE — ED Provider Notes (Signed)
[facility] Emergency Department Provider Note  ____________________________________________  Time seen: 5:15 AM  I have reviewed the triage vital signs and the nursing notes.   HISTORY  Chief Complaint Vaginal Discharge    HPI Molly Fernandez is a 39 y.o. female who complains of bilateral pelvic pain for 2 weeks. She notes that her urine is also become very foul smelling. She recently was told she had a urinary tract infection that was very mild, and the patient treated that with only Azo and cranberry juice and did not fill antibiotic prescription she was given for it. She states that it has not gotten better. She denies any flank pain fever chills nausea vomiting chest pain shortness of breath dizziness or syncope.  She has had only a single sexual partner in the last 6 years with him she is still active. She does not use barrier protection always. She has very low suspicion that she could have a sexual transmitted infection. She does note that she has engaged in anal sex recently but is not having any pain with bowel movements or rectal pain or rectal bleeding.     Past Medical History  Diagnosis Date  . Hypertension   . Migraine   . Migraine     There are no active problems to display for this patient.   Past Surgical History  Procedure Laterality Date  . Leg surgery Right     Current Outpatient Rx  Name  Route  Sig  Dispense  Refill  . metoCLOPramide (REGLAN) 10 MG tablet   Oral   Take 1 tablet (10 mg total) by mouth every 8 (eight) hours as needed for nausea or vomiting.   30 tablet   1   . metroNIDAZOLE (FLAGYL) 500 MG tablet   Oral   Take 1 tablet (500 mg total) by mouth 2 (two) times daily.   30 tablet   0   . nitrofurantoin (MACRODANTIN) 100 MG capsule   Oral   Take 1 capsule (100 mg total) by mouth 2 (two) times daily.   6 capsule   0   . ondansetron (ZOFRAN ODT) 8 MG disintegrating tablet   Oral   Take 1 tablet (8 mg  total) by mouth every 8 (eight) hours as needed for nausea or vomiting.   20 tablet   0     Allergies Penicillins  History reviewed. No pertinent family history.  Social History History  Substance Use Topics  . Smoking status: Current Every Day Smoker -- 0.50 packs/day    Types: Cigarettes  . Smokeless tobacco: Not on file  . Alcohol Use: No    Review of Systems  Constitutional: No fever or chills. No weight changes Eyes:No blurry vision or double vision.  ENT: No sore throat. Cardiovascular: No chest pain. Respiratory: No dyspnea or cough. Gastrointestinal: Pelvic pain as above.  No BRBPR or melena. Genitourinary: Dysuria and foul-smelling urine. Musculoskeletal: Negative for back pain. No joint swelling or pain. Skin: Negative for rash. Neurological: Negative for headaches, focal weakness or numbness. Psychiatric:No anxiety or depression.   Endocrine:No hot/cold intolerance, changes in energy, or sleep difficulty.  10-point ROS otherwise negative.  ____________________________________________   PHYSICAL EXAM:  VITAL SIGNS: ED Triage Vitals  Enc Vitals Group     BP 03/24/15 0338 169/109 mmHg     Pulse Rate 03/24/15 0337 89     Resp 03/24/15 0337 20     Temp 03/24/15 0337 98.3 F (36.8 C)  Temp Source 03/24/15 0337 Oral     SpO2 03/24/15 0337 100 %     Weight 03/24/15 0337 201 lb (91.173 kg)     Height 03/24/15 0337  (1.575 m)     Head Cir --      Peak Flow --      Pain Score 03/24/15 0337 8     Pain Loc --      Pain Edu? --      Excl. in GC? --      Constitutional: Alert and oriented. Well appearing and in no distress. Eyes: No scleral icterus. No conjunctival pallor. PERRL. EOMI ENT   Head: Normocephalic and atraumatic.   Nose: No congestion/rhinnorhea. No septal hematoma   Mouth/Throat: MMM, no pharyngeal erythema. No peritonsillar mass. No uvula shift.   Neck: No stridor. No SubQ emphysema. No  meningismus. Hematological/Lymphatic/Immunilogical: No cervical lymphadenopathy. Cardiovascular: RRR. Normal and symmetric distal pulses are present in all extremities. No murmurs, rubs, or gallops. Respiratory: Normal respiratory effort without tachypnea nor retractions. Breath sounds are clear and equal bilaterally. No wheezes/rales/rhonchi. Gastrointestinal: Suprapubic tenderness. No distention. There is no CVA tenderness.  No rebound, rigidity, or guarding. Genitourinary: External exam unremarkable. Speculum exam reveals thick white discharge in the vaginal vault. 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 No bleeding. No adnexal tenderness or masses. Musculoskeletal: Nontender with normal range of motion in all extremities. No joint effusions.  No lower extremity tenderness.  No edema. Neurologic:   Normal speech and language.  CN 2-10 normal. Motor grossly intact. No pronator drift.  Normal gait. No gross focal neurologic deficits are appreciated.  Skin:  Skin is warm, dry and intact. No rash noted.  No petechiae, purpura, or bullae. Psychiatric: Mood and affect are normal. Speech and behavior are normal. Patient exhibits appropriate insight and judgment.  ____________________________________________    LABS (pertinent positives/negatives) (all labs ordered are listed, but only abnormal results are displayed) Labs Reviewed  WET PREP, GENITAL - Abnormal; Notable for the following:    Clue Cells Wet Prep HPF POC FEW (*)    WBC, Wet Prep HPF POC FEW (*)    All other components within normal limits  URINALYSIS COMPLETEWITH MICROSCOPIC ([facility] ONLY) - Abnormal; Notable for the following:    Color, Urine YELLOW (*)    APPearance CLEAR (*)    Bacteria, UA RARE (*)    Squamous Epithelial / LPF 6-30 (*)    All other components within normal limits  CHLAMYDIA/NGC RT PCR ([facility] ONLY)  PREGNANCY, URINE    ____________________________________________   EKG    ____________________________________________    RADIOLOGY    ____________________________________________   PROCEDURES  ____________________________________________   INITIAL IMPRESSION / ASSESSMENT AND PLAN / ED COURSE  Pertinent labs & imaging results that were available during my care of the patient were reviewed by me and considered in my medical decision making (see chart for details).  Urinalysis is unremarkable, however patient has symptoms consistent with cystitis. We'll start her on Macrobid for this. She is also having pelvic pain with some vaginal discharge and clue cells on wet prep so we'll treat her with Flagyl for bacterial vaginosis. No evidence of torsion PID. Low suspicion for STI, we'll await follow-up GC chlamydia PCR and patient will be contacted if these are positive.  ____________________________________________   FINAL CLINICAL IMPRESSION(S) / ED DIAGNOSES  Final diagnoses:  Bacterial vaginosis   cystitis    Aneta Mins  Scotty Court, MD 03/24/15 [phone]

## 2015-03-24 NOTE — ED Notes (Signed)
Patient with complaint of lower abd pain and foul smelling urine times 2 weeks. Patient states that the pain became worse tonight.

## 2015-03-24 NOTE — ED Notes (Signed)
Pt presents to ER alert and in NAD. Pt reports lower abd pain into her groin, intermittently and "strong odor" urination. Pt also states different color vaginal discharge than normal. Pt states it has been going on for several weeks.

## 2015-03-24 NOTE — Discharge Instructions (Signed)
Bacterial Vaginosis Bacterial vaginosis is a vaginal infection that occurs when the normal balance of bacteria in the vagina is disrupted. It results from an overgrowth of certain bacteria. This is the most common vaginal infection in women of childbearing age. Treatment is important to prevent complications, especially in pregnant women, as it can cause a premature delivery. CAUSES  Bacterial vaginosis is caused by an increase in harmful bacteria that are normally present in smaller amounts in the vagina. Several different kinds of bacteria can cause bacterial vaginosis. However, the reason that the condition develops is not fully understood. RISK FACTORS Certain activities or behaviors can put you at an increased risk of developing bacterial vaginosis, including:  Having a new sex partner or multiple sex partners.  Douching.  Using an intrauterine device (IUD) for contraception. Women do not get bacterial vaginosis from toilet seats, bedding, swimming pools, or contact with objects around them. SIGNS AND SYMPTOMS  Some women with bacterial vaginosis have no signs or symptoms. Common symptoms include:  Grey vaginal discharge.  A fishlike odor with discharge, especially after sexual intercourse.  Itching or burning of the vagina and vulva.  Burning or pain with urination. DIAGNOSIS  Your health care provider will take a medical history and examine the vagina for signs of bacterial vaginosis. A sample of vaginal fluid may be taken. Your health care provider will look at this sample under a microscope to check for bacteria and abnormal cells. A vaginal pH test may also be done.  TREATMENT  Bacterial vaginosis may be treated with antibiotic medicines. These may be given in the form of a pill or a vaginal cream. A second round of antibiotics may be prescribed if the condition comes back after treatment.  HOME CARE INSTRUCTIONS   Only take over-the-counter or prescription medicines as  directed by your health care provider.  If antibiotic medicine was prescribed, take it as directed. Make sure you finish it even if you start to feel better.  Do not have sex until treatment is completed.  Tell all sexual partners that you have a vaginal infection. They should see their health care provider and be treated if they have problems, such as a mild rash or itching.  Practice safe sex by using condoms and only having one sex partner. SEEK MEDICAL CARE IF:   Your symptoms are not improving after 3 days of treatment.  You have increased discharge or pain.  You have a fever. MAKE SURE YOU:   Understand these instructions.  Will watch your condition.  Will get help right away if you are not doing well or get worse. FOR MORE INFORMATION  Centers for Disease Control and Prevention, Division of STD Prevention: www.cdc.gov/std American Sexual Health Association (ASHA): www.ashastd.org  Document Released: 08/11/2005 Document Revised: 06/01/2013 Document Reviewed: 03/23/2013 [employer] Patient Information 2015 [employer]. This information is not intended to replace advice given to you by your health care provider. Make sure you discuss any questions you have with your health care provider.  

## 2015-03-29 ENCOUNTER — Emergency Department: Payer: No Typology Code available for payment source

## 2015-03-29 ENCOUNTER — Encounter: Payer: Self-pay | Admitting: Emergency Medicine

## 2015-03-29 ENCOUNTER — Emergency Department
Admission: EM | Admit: 2015-03-29 | Discharge: 2015-03-29 | Disposition: A | Discharge status: Home / Self Care | Payer: No Typology Code available for payment source | Attending: Emergency Medicine | Admitting: Emergency Medicine

## 2015-03-29 DIAGNOSIS — S59911A Unspecified injury of right forearm, initial encounter: Secondary | ICD-10-CM | POA: Diagnosis present

## 2015-03-29 DIAGNOSIS — Y92009 Unspecified place in unspecified non-institutional (private) residence as the place of occurrence of the external cause: Secondary | ICD-10-CM | POA: Insufficient documentation

## 2015-03-29 DIAGNOSIS — W231XXA Caught, crushed, jammed, or pinched between stationary objects, initial encounter: Secondary | ICD-10-CM | POA: Diagnosis not present

## 2015-03-29 DIAGNOSIS — Y998 Other external cause status: Secondary | ICD-10-CM | POA: Insufficient documentation

## 2015-03-29 DIAGNOSIS — I10 Essential (primary) hypertension: Secondary | ICD-10-CM | POA: Diagnosis not present

## 2015-03-29 DIAGNOSIS — S5011XA Contusion of right forearm, initial encounter: Secondary | ICD-10-CM | POA: Insufficient documentation

## 2015-03-29 DIAGNOSIS — Z720 Tobacco use: Secondary | ICD-10-CM | POA: Diagnosis not present

## 2015-03-29 DIAGNOSIS — Y9389 Activity, other specified: Secondary | ICD-10-CM | POA: Insufficient documentation

## 2015-03-29 MED ORDER — IBUPROFEN 800 MG PO TABS: 800.0000 mg | ORAL_TABLET | Freq: Three times a day (TID) | ORAL | Status: DC | PRN

## 2015-03-29 MED ORDER — TRAMADOL HCL 50 MG PO TABS: 50.0000 mg | ORAL_TABLET | Freq: Three times a day (TID) | ORAL | Status: DC | PRN

## 2015-03-29 NOTE — ED Notes (Signed)
Patient to ER for c/o right forearm pain. Patient states she was playing around with her son and arm got shut in door.

## 2015-03-29 NOTE — Discharge Instructions (Signed)
Take medication as prescribed. Apply ice and elevate. Wear splint for 3 days or as long as pain continues.  Follow-up with your primary care physician as needed. Follow-up with orthopedist next week as needed for continued pain. See above to call to schedule. Return to the ER for new or worsening concerns.  Contusion A contusion is a deep bruise. Contusions are the result of an injury that caused bleeding under the skin. The contusion may turn blue, purple, or yellow. Minor injuries will give you a painless contusion, but more severe contusions may stay painful and swollen for a few weeks.  CAUSES  A contusion is usually caused by a blow, trauma, or direct force to an area of the body. SYMPTOMS   Swelling and redness of the injured area.  Bruising of the injured area.  Tenderness and soreness of the injured area.  Pain. DIAGNOSIS  The diagnosis can be made by taking a history and physical exam. An X-ray, CT scan, or MRI may be needed to determine if there were any associated injuries, such as fractures. TREATMENT  Specific treatment will depend on what area of the body was injured. In general, the best treatment for a contusion is resting, icing, elevating, and applying cold compresses to the injured area. Over-the-counter medicines may also be recommended for pain control. Ask your caregiver what the best treatment is for your contusion. HOME CARE INSTRUCTIONS   Put ice on the injured area.  Put ice in a plastic bag.  Place a towel between your skin and the bag.  Leave the ice on for 15-20 minutes, 3-4 times a day, or as directed by your health care provider.  Only take over-the-counter or prescription medicines for pain, discomfort, or fever as directed by your caregiver. Your caregiver may recommend avoiding anti-inflammatory medicines (aspirin, ibuprofen, and naproxen) for 48 hours because these medicines may increase bruising.  Rest the injured area.  If possible, elevate the  injured area to reduce swelling. SEEK IMMEDIATE MEDICAL CARE IF:   You have increased bruising or swelling.  You have pain that is getting worse.  Your swelling or pain is not relieved with medicines. MAKE SURE YOU:   Understand these instructions.  Will watch your condition.  Will get help right away if you are not doing well or get worse. Document Released: 05/21/2005 Document Revised: 08/16/2013 Document Reviewed: 06/16/2011 [facility] Patient Information 2015 [place], [place]. This information is not intended to replace advice given to you by your health care provider. Make sure you discuss any questions you have with your health care provider.

## 2015-03-29 NOTE — ED Provider Notes (Signed)
[facility] Emergency Department Provider Note  ____________________________________________  Time seen: Approximately 1730 PM  I have reviewed the triage vital signs and the nursing notes.   HISTORY  Chief Complaint Arm Injury   HPI Molly Fernandez is a 39 y.o. female presents to ER for complaints of right forearm pain. Patient reports that just prior to arrival she was playing with her son. Patient states that she was chasing him in the house and he shut the bedroom door and  In front of her but states that as he started to shut the door her right forearm was in the door and the door shut on her right forearm. Reports right forearm pain since. Denies fall or other injury. Denies head injury or loss of consciousness.  Reports pain to right forearm is 5 out of 10 and worse with movement and palpation. Denies pain radiation. Denies numbness or tingling sensation.   Past Medical History  Diagnosis Date  . Hypertension   . Migraine   . Migraine     There are no active problems to display for this patient.   Past Surgical History  Procedure Laterality Date  . Leg surgery Right     Current Outpatient Rx  Name  Route  Sig  Dispense  Refill  . lisinopril          .           .           .             Allergies Penicillins  No family history on file.  Social History History  Substance Use Topics  . Smoking status: Current Every Day Smoker -- 0.50 packs/day    Types: Cigarettes  . Smokeless tobacco: Not on file  . Alcohol Use: No    Review of Systems Constitutional: No fever/chills Eyes: No visual changes. ENT: No sore throat. Cardiovascular: Denies chest pain. Respiratory: Denies shortness of breath. Gastrointestinal: No abdominal pain.  No nausea, no vomiting.  No diarrhea.  No constipation. Genitourinary: Negative for dysuria. Musculoskeletal: Negative for back pain. Right forearm pain. Skin: Negative for rash. Neurological:  Negative for headaches, focal weakness or numbness.  10-point ROS otherwise negative.  ____________________________________________   PHYSICAL EXAM:  VITAL SIGNS: ED Triage Vitals  Enc Vitals Group     BP 03/29/15 1655 154/109 mmHg     Pulse Rate 03/29/15 1655 90     Resp 03/29/15 1655 18     Temp 03/29/15 1655 98.4 F (36.9 C)     Temp Source 03/29/15 1655 Oral     SpO2 03/29/15 1655 100 %     Weight 03/29/15 1655 189 lb (85.73 kg)     Height 03/29/15 1655 5\' 3"  (1.6 m)     Head Cir --      Peak Flow --      Pain Score 03/29/15 1656 8     Pain Loc --      Pain Edu? --      Excl. in GC? --   States has not yet taken blood pressure medication today but will take once home.   Constitutional: Alert and oriented. Well appearing and in no acute distress. Eyes: Conjunctivae are normal. PERRL. EOMI. Head: Atraumatic.  Nose: No congestion/rhinnorhea.  Mouth/Throat: Mucous membranes are moist.  Oropharynx non-erythematous. Neck: No stridor.  No cervical spine tenderness to palpation. Hematological/Lymphatic/Immunilogical: No cervical lymphadenopathy. Cardiovascular: Normal rate, regular rhythm. Grossly normal heart sounds.  Good peripheral circulation. Respiratory: Normal respiratory effort.  No retractions. Lungs CTAB. Gastrointestinal: Soft and nontender. No distention. Normal Bowel sounds.  No abdominal bruits. No CVA tenderness. Musculoskeletal: No lower or upper extremity tenderness nor edema.  No joint effusions. Bilateral pedal pulses equal and easily palpated. No cervical, thoracic or lumbar tenderness to palpation. Except:  Right mid to distal forearm mild to mod TTP, no swelling or ecchymosis. Full ROM. Distal radial pulses equal bilateral. Bilateral hand grips equal. No pain in anatomical snuff box.  Neurologic:  Normal speech and language. No gross focal neurologic deficits are appreciated. No gait instability. Skin:  Skin is warm, dry and intact. No rash  noted. Psychiatric: Mood and affect are normal. Speech and behavior are normal.  ____________________________________________   LABS (all labs ordered are listed, but only abnormal results are displayed)  Labs Reviewed - No data to display  RADIOLOGY   EXAM: RIGHT FOREARM - 2 VIEW  COMPARISON: None.  FINDINGS: There is no evidence of fracture or other focal bone lesions. Soft tissue swelling is seen overlying the dorsal aspect of the proximal ulna.  IMPRESSION: Proximal forearm soft tissue swelling. No evidence of fracture.   Electronically Signed By: Myles Rosenthal M.D. On: 03/29/2015 17:38  I, Renford Dills, personally viewed and evaluated these images as part of my medical decision making.   ____________________________________________   PROCEDURES  Procedure(s) performed:   SPLINT APPLICATION Date/Time: 6:20 PM Authorized by: Renford Dills Consent: Verbal consent obtained. Risks and benefits: risks, benefits and alternatives were discussed Consent given by: patient Splint applied by: ed technician Location details: right forearm Splint type: velcro cock up Post-procedure: The splinted body part was neurovascularly unchanged following the procedure. Patient tolerance: Patient tolerated the procedure well with no immediate complications.    ____________________________________________   INITIAL IMPRESSION / ASSESSMENT AND PLAN / ED COURSE  Pertinent labs & imaging results that were available during my care of the patient were reviewed by me and considered in my medical decision making (see chart for details).  Very well-appearing patient. No acute distress. Presents to the ER for complaints of right forearm pain post son accidentally shutting door on right forearm. Patient denies other fall or other injury. States pain only to right forearm. Mid to distal right forearm pain on exam. Skin intact. Right forearm x-ray with proximal forearm soft tissue  swelling noted evidence of fracture.   Right forearm and placed in Velcro cock-up splint for support. Apply ice and elevate. When necessary ibuprofen and tramadol. Follow up with orthopedics next week as needed for continued pain. Discussed follow-up and return parameters.. Patient verbalized understanding and agreed to plan. ____________________________________________   FINAL CLINICAL IMPRESSION(S) / ED DIAGNOSES  Final diagnoses:  Forearm contusion, right, initial encounter       Renford Dills, NP 03/29/15 1820  Governor Rooks, MD 03/30/15 2102

## 2015-09-03 IMAGING — CR DG CHEST 2V
1 series · 2 of 2 positions shown · non-contrast
Comparison: Chest radiograph from 12/01/2013

CLINICAL DATA: Status post fall in shower, with left anterior upper
chest pain. Initial encounter.

EXAM:
CHEST  2 VIEW

[Series 1: dg chest 2 view · 0.14mm/px · 2 of 2 slices shown]
[im 1/2]
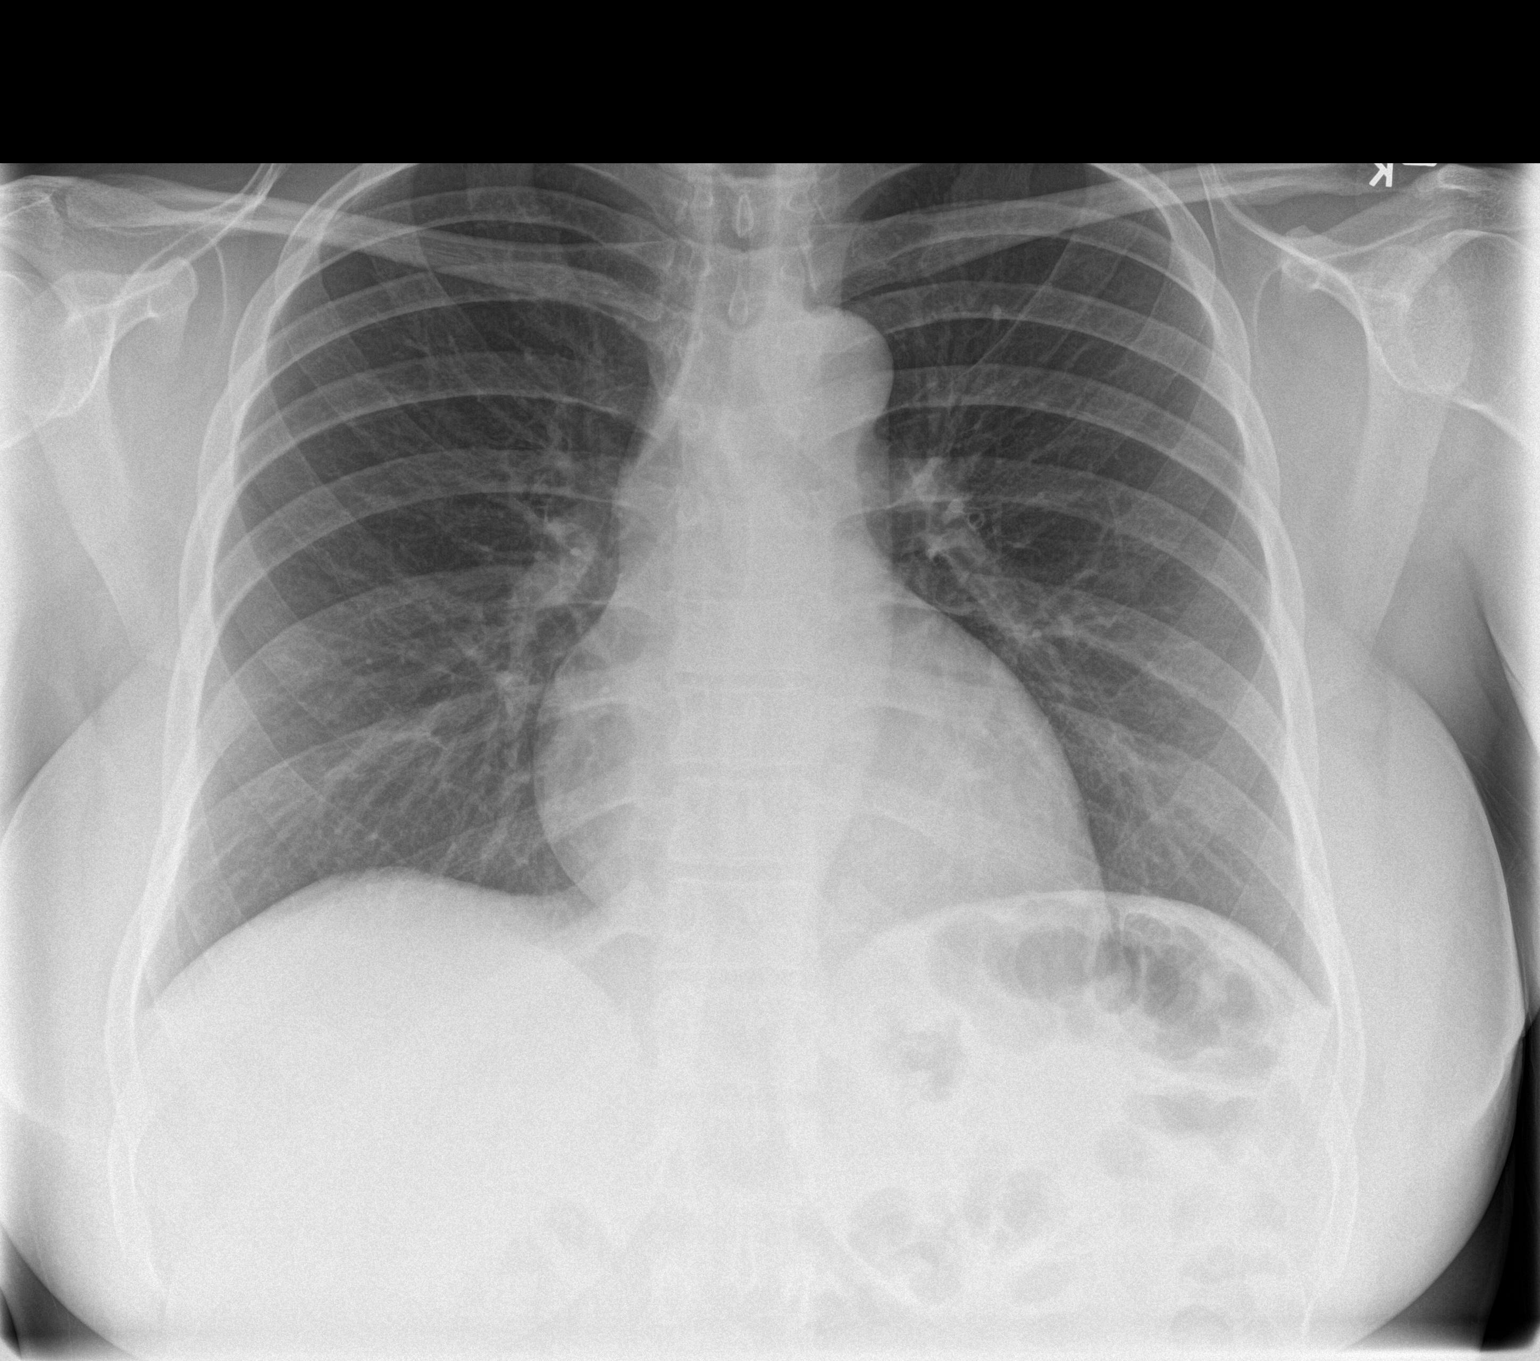
[im 2/2]
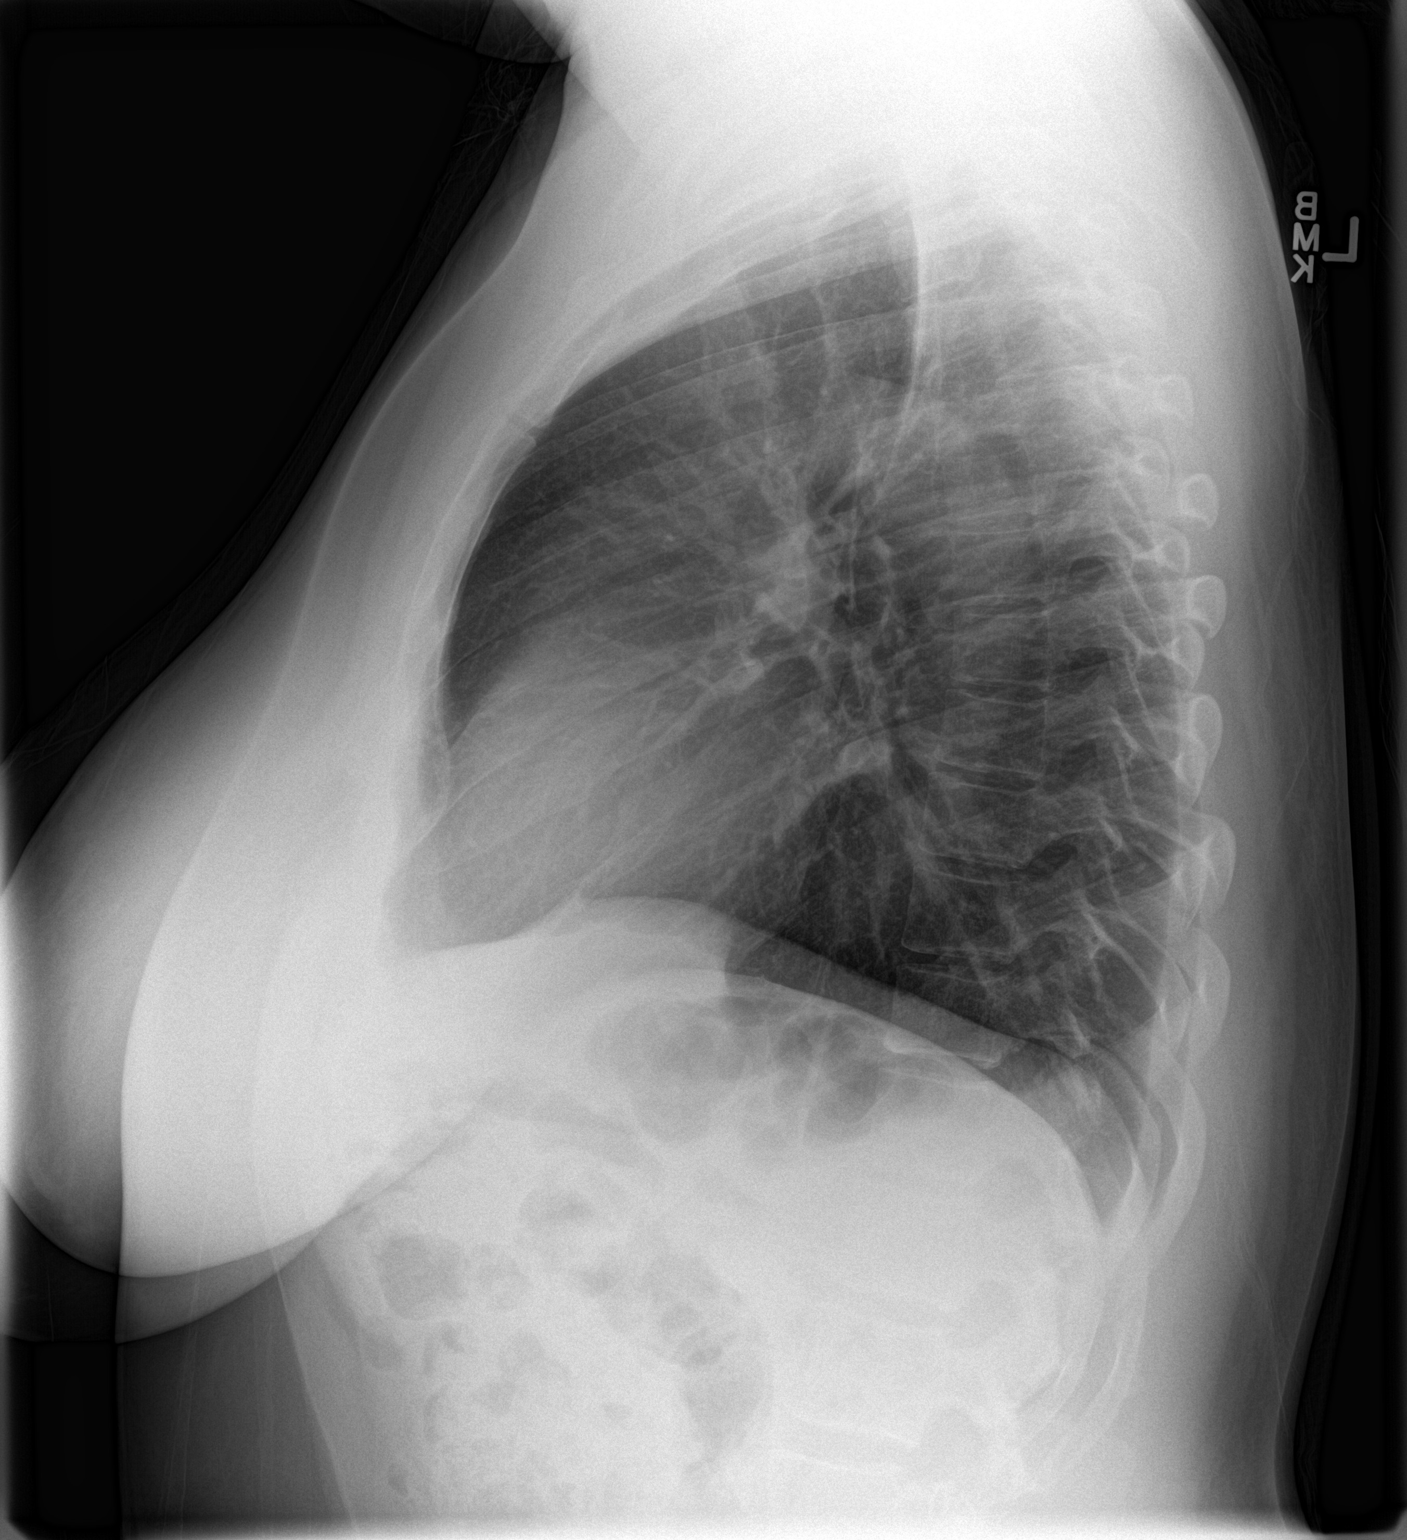

[2 of 2 positions shown; findings below may reference images not displayed]

FINDINGS: The lungs are well-aerated and clear. There is no evidence of focal
opacification, pleural effusion or pneumothorax.

The heart is normal in size; the mediastinal contour is within
normal limits. No acute osseous abnormalities are seen.
IMPRESSION: No acute cardiopulmonary process seen. No displaced rib fractures
identified.

## 2015-09-03 IMAGING — CR DG HAND COMPLETE 3+V*R*
1 series · 3 of 3 positions shown · non-contrast
Comparison: Right hand radiographs performed 03/15/2007

CLINICAL DATA: Status post fall in shower, with right hand pain,
particularly about the fifth metacarpal. Initial encounter.

EXAM:
RIGHT HAND - COMPLETE 3+ VIEW

[Series 1: dg hand complete right · 0.14mm/px · 3 of 3 slices shown]
[im 1/3]
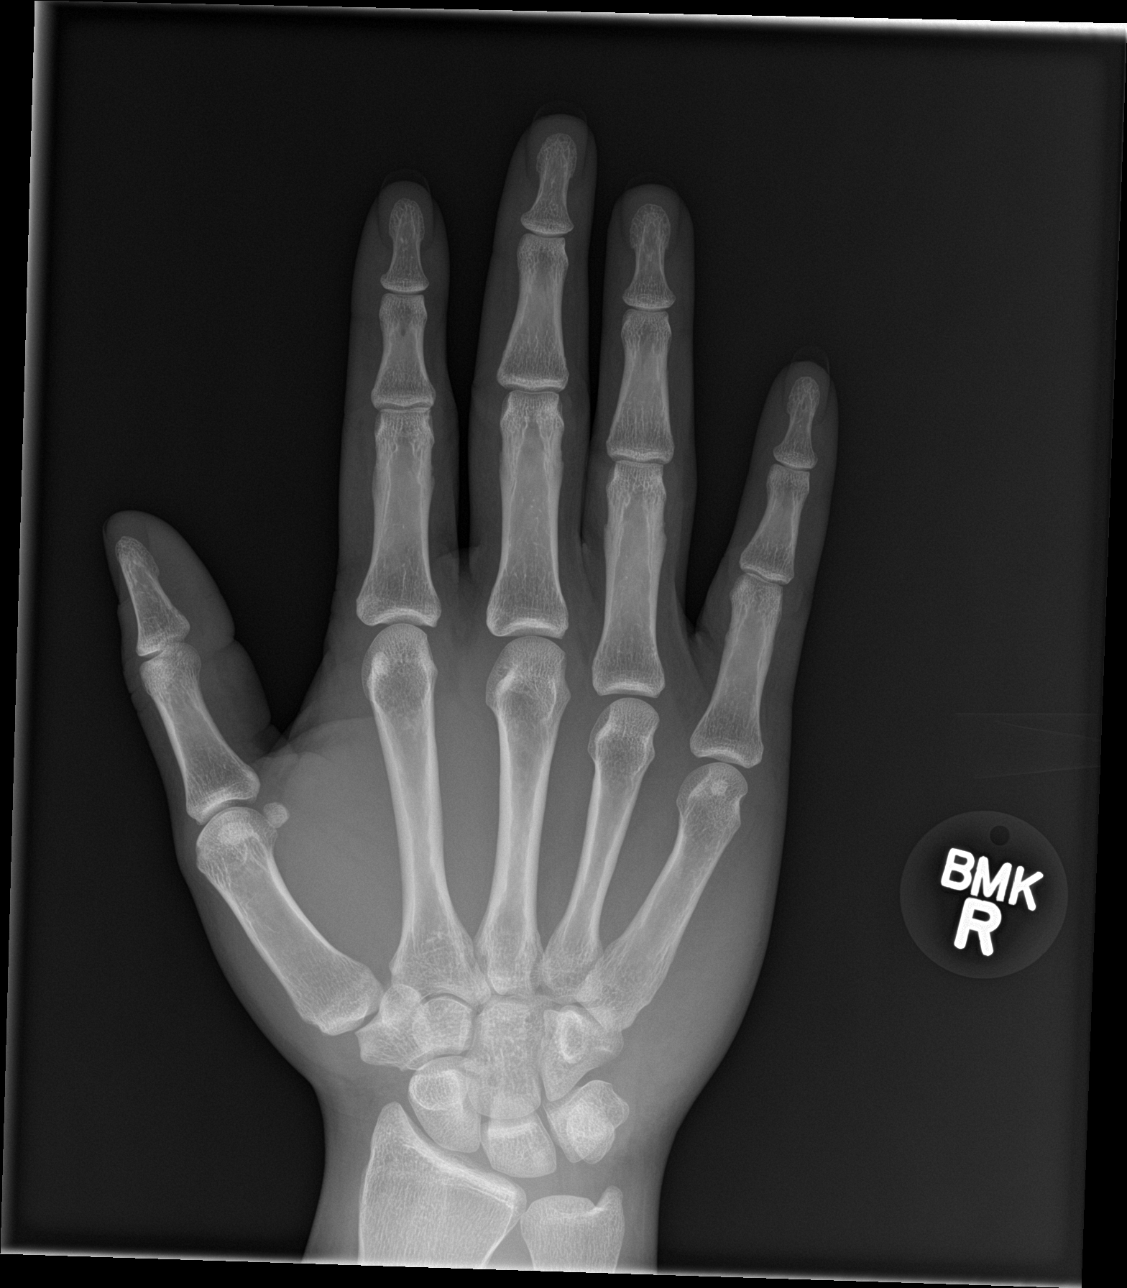
[im 2/3]
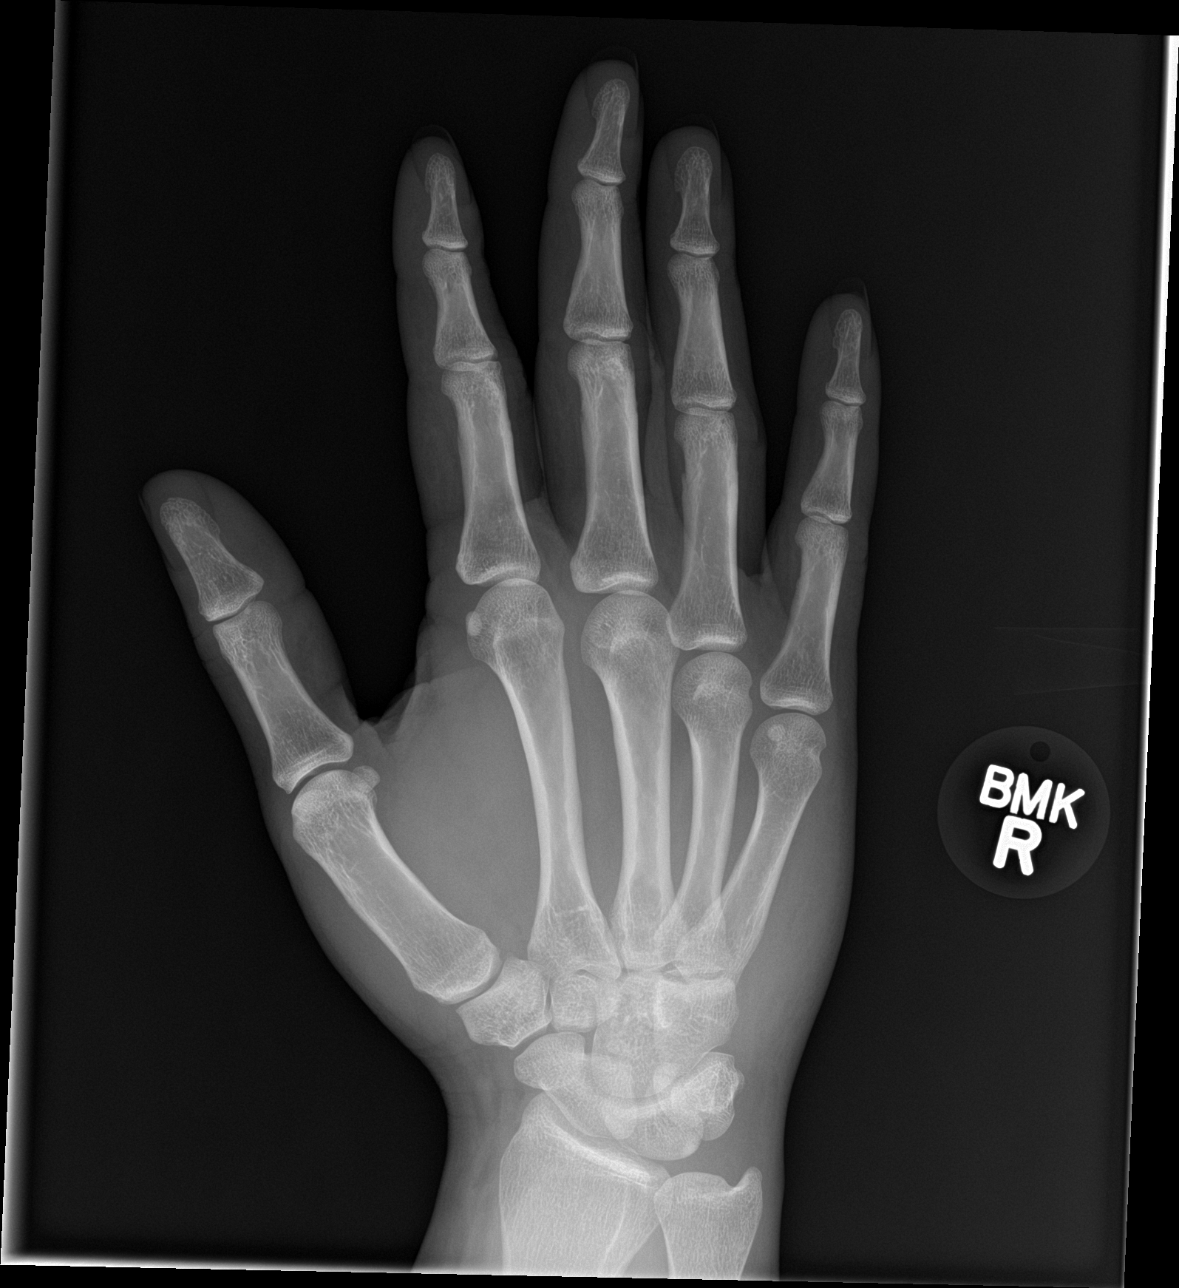
[im 3/3]
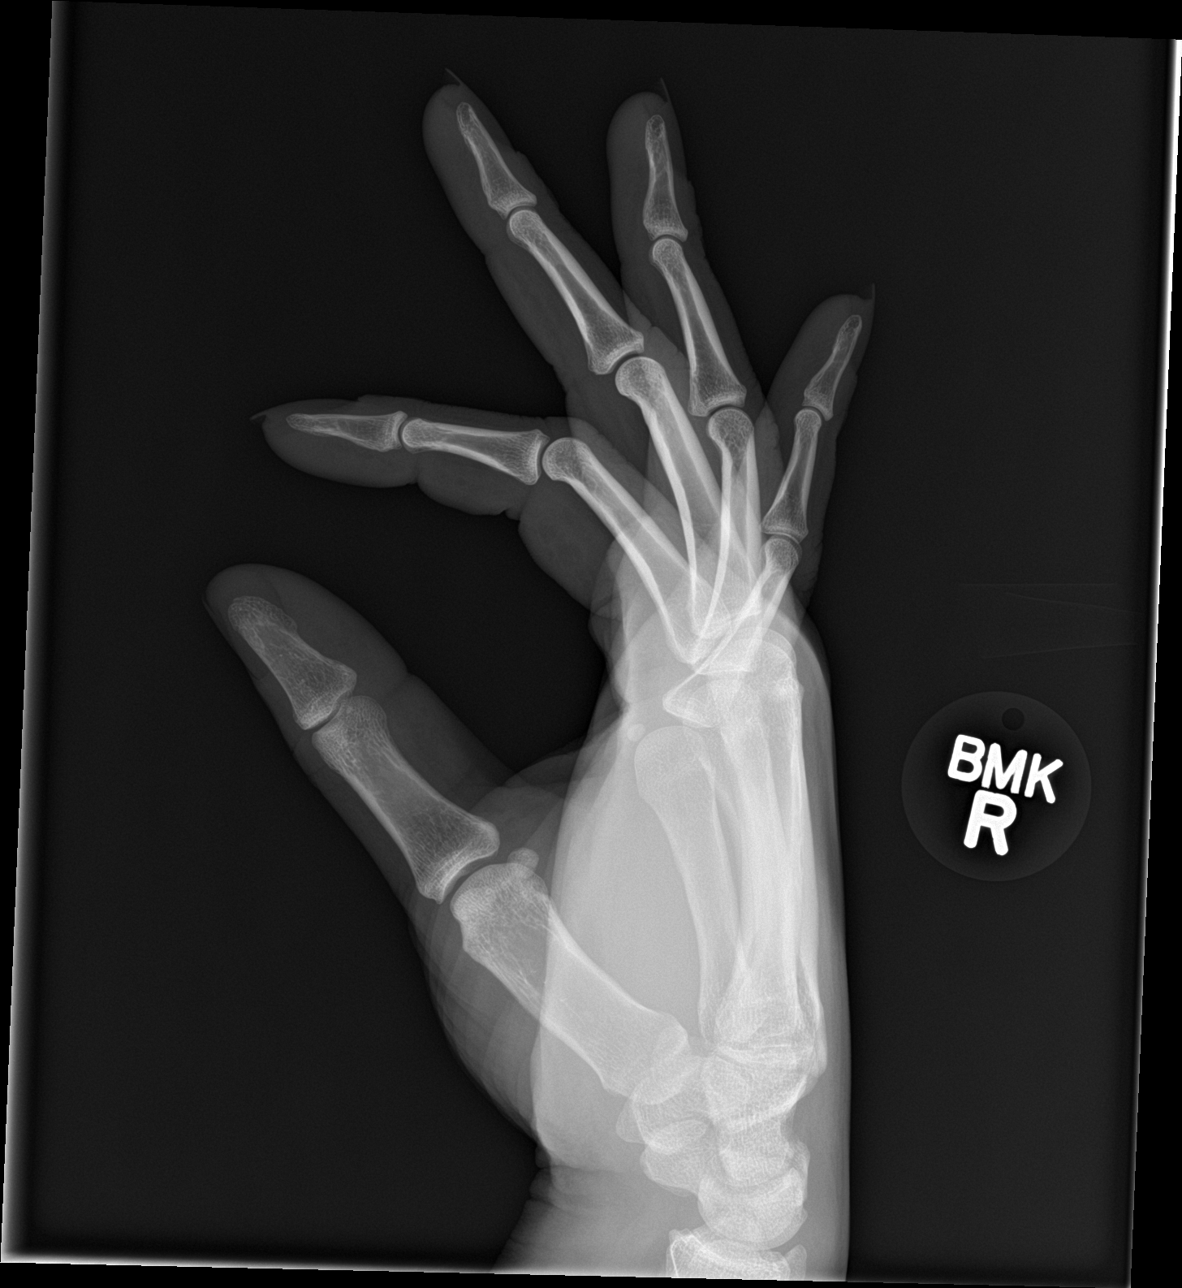

[3 of 3 positions shown; findings below may reference images not displayed]

FINDINGS: There is no evidence of fracture or dislocation. The joint spaces
are preserved. The carpal rows are intact, and demonstrate normal
alignment. The soft tissues are unremarkable in appearance.
IMPRESSION: No evidence of fracture or dislocation.

## 2015-11-10 IMAGING — CR DG FOREARM 2V*R*
1 series · 2 of 2 positions shown · non-contrast
Comparison: None.

CLINICAL DATA: Right forearm slammed in door. Right forearm injury
and pain. Initial encounter.

EXAM:
RIGHT FOREARM - 2 VIEW

[Series 1: ap · 0.17mm/px · 2 of 2 slices shown]
[im 1/2]
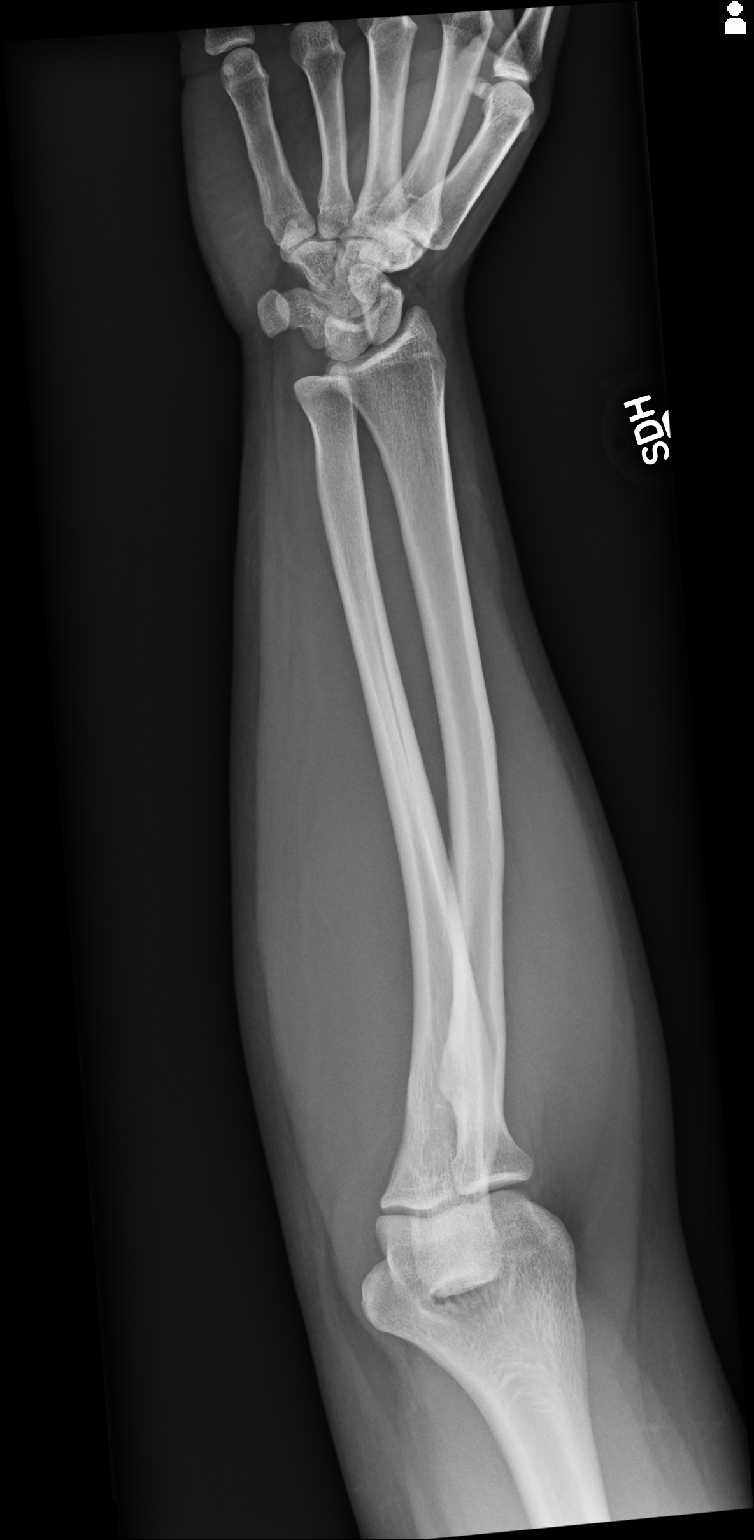
[im 2/2]
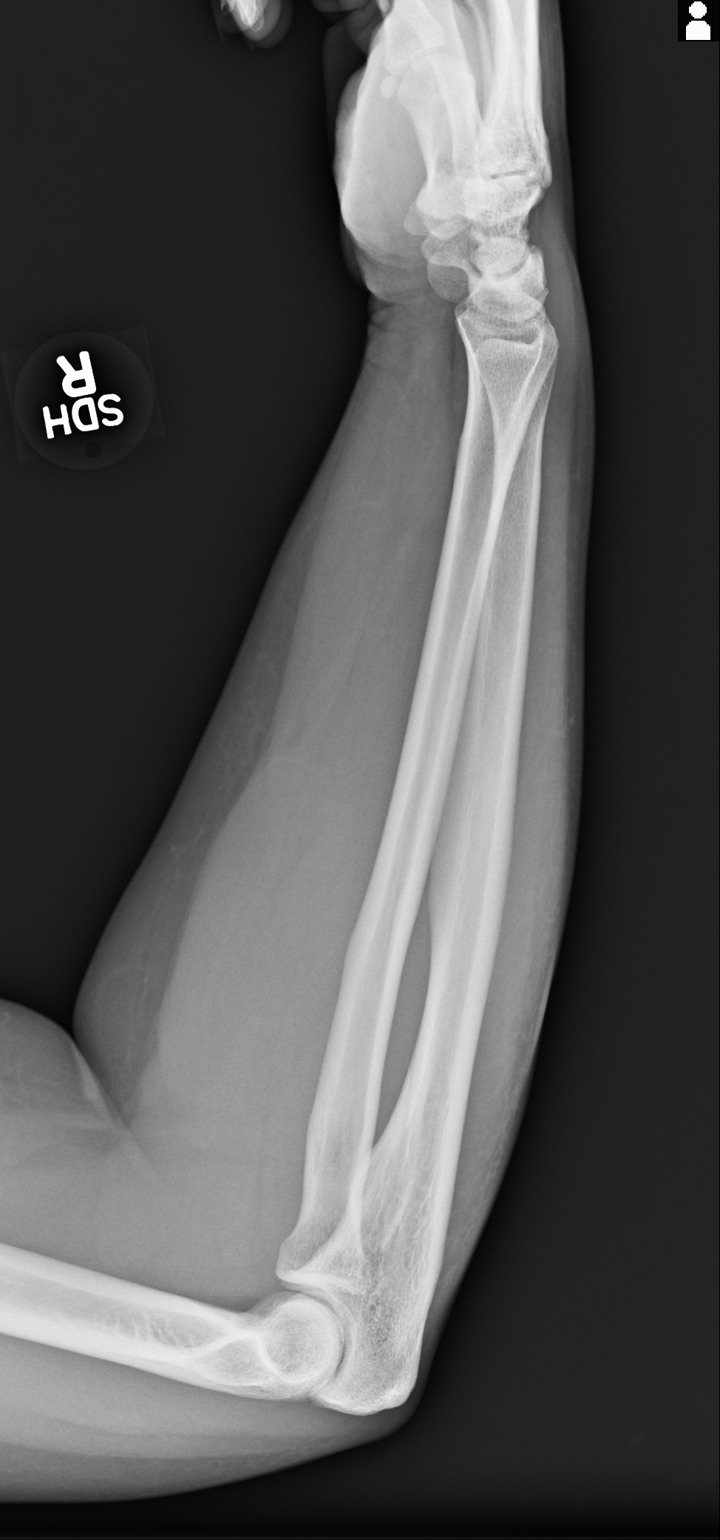

[2 of 2 positions shown; findings below may reference images not displayed]

FINDINGS: There is no evidence of fracture or other focal bone lesions. Soft
tissue swelling is seen overlying the dorsal aspect of the proximal
ulna.
IMPRESSION: Proximal forearm soft tissue swelling.  No evidence of fracture.

## 2015-11-14 ENCOUNTER — Encounter: Payer: Self-pay | Admitting: Emergency Medicine

## 2015-11-14 ENCOUNTER — Emergency Department: Payer: BLUE CROSS/BLUE SHIELD

## 2015-11-14 ENCOUNTER — Emergency Department
Admission: EM | Admit: 2015-11-14 | Discharge: 2015-11-15 | Disposition: A | Discharge status: Home / Self Care | Payer: BLUE CROSS/BLUE SHIELD | Attending: Emergency Medicine | Admitting: Emergency Medicine

## 2015-11-14 DIAGNOSIS — H53149 Visual discomfort, unspecified: Secondary | ICD-10-CM | POA: Insufficient documentation

## 2015-11-14 DIAGNOSIS — I10 Essential (primary) hypertension: Secondary | ICD-10-CM | POA: Diagnosis not present

## 2015-11-14 DIAGNOSIS — Z792 Long term (current) use of antibiotics: Secondary | ICD-10-CM | POA: Diagnosis not present

## 2015-11-14 DIAGNOSIS — R51 Headache: Secondary | ICD-10-CM | POA: Diagnosis not present

## 2015-11-14 DIAGNOSIS — R519 Headache, unspecified: Secondary | ICD-10-CM

## 2015-11-14 DIAGNOSIS — R112 Nausea with vomiting, unspecified: Secondary | ICD-10-CM | POA: Diagnosis not present

## 2015-11-14 DIAGNOSIS — Z880 Allergy status to penicillin: Secondary | ICD-10-CM | POA: Insufficient documentation

## 2015-11-14 DIAGNOSIS — F17210 Nicotine dependence, cigarettes, uncomplicated: Secondary | ICD-10-CM | POA: Diagnosis not present

## 2015-11-14 MED ORDER — DIPHENHYDRAMINE HCL 50 MG/ML IJ SOLN
12.5000 mg | Freq: Once | INTRAMUSCULAR | Status: AC
  Administered 2015-11-15: 12.5 mg via INTRAVENOUS
  Filled 2015-11-14: qty 1

## 2015-11-14 MED ORDER — PROCHLORPERAZINE EDISYLATE 5 MG/ML IJ SOLN
5.0000 mg | Freq: Once | INTRAMUSCULAR | Status: AC
  Administered 2015-11-15: 5 mg via INTRAVENOUS
  Filled 2015-11-14: qty 2

## 2015-11-14 MED ORDER — DEXAMETHASONE SODIUM PHOSPHATE 10 MG/ML IJ SOLN
10.0000 mg | Freq: Once | INTRAMUSCULAR | Status: AC
  Administered 2015-11-15: 10 mg via INTRAVENOUS
  Filled 2015-11-14: qty 1

## 2015-11-14 NOTE — ED Notes (Signed)
Patient states she has a hx of HTN and that she has not taken her BP medication "for awhile".

## 2015-11-14 NOTE — ED Notes (Signed)
Spoke with Dr Inocencio HomesGayle regarding pt; no orders to be done at this time until evaluated further

## 2015-11-14 NOTE — ED Provider Notes (Signed)
[facility] Provider Note  ____________________________________________  Time seen: Approximately 11:13 PM  I have reviewed the triage vital signs and the nursing notes.   HISTORY  Chief Complaint Headache    HPI Molly Fernandez is a 40 y.o. female who presents to the ED from home with a chief complaint of headache. Patient reports awakening 2 mornings ago with frontal headache which wraps into her left temple and back of her head. Symptoms associated with photophobia, dizziness, nausea and vomiting. Patient relates history of migraine headaches, although she has not had one for almost one year. Also states she has not taken blood pressure medicines for over one year. Denies recent fever, chills, neck pain, chest pain, shortness of breath, abdominal pain, dysuria, diarrhea. Denies recent travel or trauma. Denies use of stimulants including decongestants. Nothing makes her symptoms better or worse.   Past Medical History  Diagnosis Date  . Hypertension   . Migraine   . Migraine     There are no active problems to display for this patient.   Past Surgical History  Procedure Laterality Date  . Leg surgery Right     Current Outpatient Rx  Name  Route  Sig  Dispense  Refill  . ibuprofen (ADVIL,MOTRIN) 800 MG tablet   Oral   Take 1 tablet (800 mg total) by mouth every 8 (eight) hours as needed for mild pain or moderate pain.   15 tablet   0   . metoCLOPramide (REGLAN) 10 MG tablet   Oral   Take 1 tablet (10 mg total) by mouth every 8 (eight) hours as needed for nausea or vomiting.   30 tablet   1   . metroNIDAZOLE (FLAGYL) 500 MG tablet   Oral   Take 1 tablet (500 mg total) by mouth 2 (two) times daily.   30 tablet   0   . nitrofurantoin (MACRODANTIN) 100 MG capsule   Oral   Take 1 capsule (100 mg total) by mouth 2 (two) times daily.   6 capsule   0   . ondansetron (ZOFRAN ODT) 8 MG disintegrating tablet   Oral  Take 1 tablet (8 mg total) by mouth every 8 (eight) hours as needed for nausea or vomiting.   20 tablet   0   . traMADol (ULTRAM) 50 MG tablet   Oral   Take 1 tablet (50 mg total) by mouth every 8 (eight) hours as needed (Do not drive or operate machinery while taking as can cause drowsiness.).   12 tablet   0     Allergies Penicillins  Family history Migraines Cerebral aneurysm - aunt  Social History Social History  Substance Use Topics  . Smoking status: Current Every Day Smoker -- 0.50 packs/day    Types: Cigarettes  . Smokeless tobacco: None  . Alcohol Use: No    Review of Systems  Constitutional: No fever/chills. Eyes: Positive for photophobia. ENT: No sore throat. Cardiovascular: Denies chest pain. Respiratory: Denies shortness of breath. Gastrointestinal: No abdominal pain.  Positive for nausea and vomiting.  No diarrhea.  No constipation. Genitourinary: Negative for dysuria. Musculoskeletal: Negative for back pain. Skin: Negative for rash. Neurological: Positive for headache. Negative for focal weakness or numbness.  10-point ROS otherwise negative.  ____________________________________________   PHYSICAL EXAM:  VITAL SIGNS: ED Triage Vitals  Enc Vitals Group     BP 11/14/15 2141 155/88 mmHg     Pulse Rate 11/14/15 2141 80     Resp 11/14/15  2141 18     Temp 11/14/15 2141 98 F (36.7 C)     Temp Source 11/14/15 2141 Oral     SpO2 11/14/15 2141 99 %     Weight --      Height --      Head Cir --      Peak Flow --      Pain Score 11/14/15 2141 10     Pain Loc --      Pain Edu? --      Excl. in GC? --     Constitutional: Alert and oriented. Well appearing and in Mild acute distress. Eyes: Conjunctivae are normal. PERRL. EOMI. funduscopy within normal limits. Head: Atraumatic. Nose: No congestion/rhinnorhea. Mouth/Throat: Mucous membranes are moist.  Oropharynx non-erythematous. Neck: No stridor.  No carotid bruits. Supple neck without  meningismus. Cardiovascular: Normal rate, regular rhythm. Grossly normal heart sounds.  Good peripheral circulation. Respiratory: Normal respiratory effort.  No retractions. Lungs CTAB. Gastrointestinal: Soft and nontender. No distention. No abdominal bruits. No CVA tenderness. Musculoskeletal: No lower extremity tenderness nor edema.  No joint effusions. Neurologic:  Alert and oriented 4. Normal speech and language. No gross focal neurologic deficits are appreciated.  Skin:  Skin is warm, dry and intact. No rash noted. Psychiatric: Mood and affect are normal. Speech and behavior are normal.  ____________________________________________   LABS (all labs ordered are listed, but only abnormal results are displayed)  Labs Reviewed - No data to display ____________________________________________  EKG  None ____________________________________________  RADIOLOGY  CT head without contrast interpreted per Dr. Mosetta Putt: Unremarkable head CT. ____________________________________________   PROCEDURES  Procedure(s) performed: None  Critical Care performed: No  ____________________________________________   INITIAL IMPRESSION / ASSESSMENT AND PLAN / ED COURSE  Pertinent labs & imaging results that were available during my care of the patient were reviewed by me and considered in my medical decision making (see chart for details).  40 year old female with a history of migraine headache and untreated hypertension who presents with frontal and left-sided headache, photophobia, nausea/vomiting associated with elevated blood pressure. Will obtain CT head to evaluate for a cranial hemorrhage; initiate IV headache cocktail and reassess.  ----------------------------------------- 1:34 AM on 11/15/2015 -----------------------------------------  Updated patient and family members of negative CT scan results. She is feeling much better. Currently normotensive. Advised patient to follow up  with her PCP for blood pressure recheck. Strict return precautions given. Patient verbalizes understanding and agrees with plan of care. ____________________________________________   FINAL CLINICAL IMPRESSION(S) / ED DIAGNOSES  Final diagnoses:  Acute nonintractable headache, unspecified headache type  Essential hypertension      Irean Hong, MD 11/15/15 [phone]

## 2015-11-14 NOTE — ED Notes (Signed)
Patient ambulatory to triage with steady gait, without difficulty or distress noted; pt reports x 2 days having left sided HA accomp by dizziness, nausea, photo and sound sensitivity

## 2015-11-15 MED ORDER — PROCHLORPERAZINE MALEATE 10 MG PO TABS: 10.0000 mg | ORAL_TABLET | Freq: Four times a day (QID) | ORAL | Status: DC | PRN

## 2015-11-15 NOTE — ED Notes (Signed)

## 2015-11-15 NOTE — Discharge Instructions (Signed)
1. You may take Compazine (#20) as needed for headache and nausea. 2. Have your blood pressure rechecked in 3 days. 3. Return to the ER for worsening symptoms, persistent vomiting, difficult to breathing or other concerns.  Hypertension Hypertension, commonly called high blood pressure, is when the force of blood pumping through your arteries is too strong. Your arteries are the blood vessels that carry blood from your heart throughout your body. A blood pressure reading consists of a higher number over a lower number, such as 110/72. The higher number (systolic) is the pressure inside your arteries when your heart pumps. The lower number (diastolic) is the pressure inside your arteries when your heart relaxes. Ideally you want your blood pressure below 120/80. Hypertension forces your heart to work harder to pump blood. Your arteries may become narrow or stiff. Having untreated or uncontrolled hypertension can cause heart attack, stroke, kidney disease, and other problems. RISK FACTORS Some risk factors for high blood pressure are controllable. Others are not.  Risk factors you cannot control include:   Race. You may be at higher risk if you are African American.  Age. Risk increases with age.  Gender. Men are at higher risk than women before age 64 years. After age 40, women are at higher risk than men. Risk factors you can control include:  Not getting enough exercise or physical activity.  Being overweight.  Getting too much fat, sugar, calories, or salt in your diet.  Drinking too much alcohol. SIGNS AND SYMPTOMS Hypertension does not usually cause signs or symptoms. Extremely high blood pressure (hypertensive crisis) may cause headache, anxiety, shortness of breath, and nosebleed. DIAGNOSIS To check if you have hypertension, your health care provider will measure your blood pressure while you are seated, with your arm held at the level of your heart. It should be measured at least  twice using the same arm. Certain conditions can cause a difference in blood pressure between your right and left arms. A blood pressure reading that is higher than normal on one occasion does not mean that you need treatment. If it is not clear whether you have high blood pressure, you may be asked to return on a different day to have your blood pressure checked again. Or, you may be asked to monitor your blood pressure at home for 1 or more weeks. TREATMENT Treating high blood pressure includes making lifestyle changes and possibly taking medicine. Living a healthy lifestyle can help lower high blood pressure. You may need to change some of your habits. Lifestyle changes may include:  Following the DASH diet. This diet is high in fruits, vegetables, and whole grains. It is low in salt, red meat, and added sugars.  Keep your sodium intake below 2,300 mg per day.  Getting at least 30-45 minutes of aerobic exercise at least 4 times per week.  Losing weight if necessary.  Not smoking.  Limiting alcoholic beverages.  Learning ways to reduce stress. Your health care provider may prescribe medicine if lifestyle changes are not enough to get your blood pressure under control, and if one of the following is true:  You are 4-49 years of age and your systolic blood pressure is above 140.  You are 36 years of age or older, and your systolic blood pressure is above 150.  Your diastolic blood pressure is above 90.  You have diabetes, and your systolic blood pressure is over 140 or your diastolic blood pressure is over 90.  You have kidney disease and  your blood pressure is above 140/90.  You have heart disease and your blood pressure is above 140/90. Your personal target blood pressure may vary depending on your medical conditions, your age, and other factors. HOME CARE INSTRUCTIONS  Have your blood pressure rechecked as directed by your health care provider.   Take medicines only as  directed by your health care provider. Follow the directions carefully. Blood pressure medicines must be taken as prescribed. The medicine does not work as well when you skip doses. Skipping doses also puts you at risk for problems.  Do not smoke.   Monitor your blood pressure at home as directed by your health care provider. SEEK MEDICAL CARE IF:   You think you are having a reaction to medicines taken.  You have recurrent headaches or feel dizzy.  You have swelling in your ankles.  You have trouble with your vision. SEEK IMMEDIATE MEDICAL CARE IF:  You develop a severe headache or confusion.  You have unusual weakness, numbness, or feel faint.  You have severe chest or abdominal pain.  You vomit repeatedly.  You have trouble breathing. MAKE SURE YOU:   Understand these instructions.  Will watch your condition.  Will get help right away if you are not doing well or get worse.   This information is not intended to replace advice given to you by your health care provider. Make sure you discuss any questions you have with your health care provider.   Document Released: 08/11/2005 Document Revised: 12/26/2014 Document Reviewed: 06/03/2013 Elsevier Interactive Patient Education 2016 [employer]  Migraine Headache A migraine headache is an intense, throbbing pain on one or both sides of your head. A migraine can last for 30 minutes to several hours. CAUSES  The exact cause of a migraine headache is not always known. However, a migraine may be caused when nerves in the brain become irritated and release chemicals that cause inflammation. This causes pain. Certain things may also trigger migraines, such as:  Alcohol.  Smoking.  Stress.  Menstruation.  Aged cheeses.  Foods or drinks that contain nitrates, glutamate, aspartame, or tyramine.  Lack of sleep.  Chocolate.  Caffeine.  Hunger.  Physical exertion.  Fatigue.  Medicines used to treat chest pain  (nitroglycerine), birth control pills, estrogen, and some blood pressure medicines. SIGNS AND SYMPTOMS  Pain on one or both sides of your head.  Pulsating or throbbing pain.  Severe pain that prevents daily activities.  Pain that is aggravated by any physical activity.  Nausea, vomiting, or both.  Dizziness.  Pain with exposure to bright lights, loud noises, or activity.  General sensitivity to bright lights, loud noises, or smells. Before you get a migraine, you may get warning signs that a migraine is coming (aura). An aura may include:  Seeing flashing lights.  Seeing bright spots, halos, or zigzag lines.  Having tunnel vision or blurred vision.  Having feelings of numbness or tingling.  Having trouble talking.  Having muscle weakness. DIAGNOSIS  A migraine headache is often diagnosed based on:  Symptoms.  Physical exam.  A CT scan or MRI of your head. These imaging tests cannot diagnose migraines, but they can help rule out other causes of headaches. TREATMENT Medicines may be given for pain and nausea. Medicines can also be given to help prevent recurrent migraines.  HOME CARE INSTRUCTIONS  Only take over-the-counter or prescription medicines for pain or discomfort as directed by your health care provider. The use of long-term narcotics is not  recommended.  Lie down in a dark, quiet room when you have a migraine.  Keep a journal to find out what may trigger your migraine headaches. For example, write down:  What you eat and drink.  How much sleep you get.  Any change to your diet or medicines.  Limit alcohol consumption.  Quit smoking if you smoke.  Get 7-9 hours of sleep, or as recommended by your health care provider.  Limit stress.  Keep lights dim if bright lights bother you and make your migraines worse. SEEK IMMEDIATE MEDICAL CARE IF:   Your migraine becomes severe.  You have a fever.  You have a stiff neck.  You have vision  loss.  You have muscular weakness or loss of muscle control.  You start losing your balance or have trouble walking.  You feel faint or pass out.  You have severe symptoms that are different from your first symptoms. MAKE SURE YOU:   Understand these instructions.  Will watch your condition.  Will get help right away if you are not doing well or get worse.   This information is not intended to replace advice given to you by your health care provider. Make sure you discuss any questions you have with your health care provider.   Document Released: 08/11/2005 Document Revised: 09/01/2014 Document Reviewed: 04/18/2013 Elsevier Interactive Patient Education [employer]

## 2016-05-09 ENCOUNTER — Emergency Department: Payer: BLUE CROSS/BLUE SHIELD

## 2016-05-09 ENCOUNTER — Emergency Department
Admission: EM | Admit: 2016-05-09 | Discharge: 2016-05-09 | Disposition: A | Discharge status: Home / Self Care | Payer: BLUE CROSS/BLUE SHIELD | Attending: Student in an Organized Health Care Education/Training Program | Admitting: Student in an Organized Health Care Education/Training Program

## 2016-05-09 ENCOUNTER — Encounter: Payer: Self-pay | Admitting: Emergency Medicine

## 2016-05-09 DIAGNOSIS — M25561 Pain in right knee: Secondary | ICD-10-CM

## 2016-05-09 DIAGNOSIS — R600 Localized edema: Secondary | ICD-10-CM | POA: Insufficient documentation

## 2016-05-09 DIAGNOSIS — Z792 Long term (current) use of antibiotics: Secondary | ICD-10-CM | POA: Diagnosis not present

## 2016-05-09 DIAGNOSIS — G8929 Other chronic pain: Secondary | ICD-10-CM | POA: Diagnosis not present

## 2016-05-09 DIAGNOSIS — F17210 Nicotine dependence, cigarettes, uncomplicated: Secondary | ICD-10-CM | POA: Diagnosis not present

## 2016-05-09 DIAGNOSIS — Z791 Long term (current) use of non-steroidal anti-inflammatories (NSAID): Secondary | ICD-10-CM | POA: Insufficient documentation

## 2016-05-09 DIAGNOSIS — I10 Essential (primary) hypertension: Secondary | ICD-10-CM | POA: Diagnosis not present

## 2016-05-09 DIAGNOSIS — Z79899 Other long term (current) drug therapy: Secondary | ICD-10-CM | POA: Diagnosis not present

## 2016-05-09 MED ORDER — HYDROCODONE-ACETAMINOPHEN 5-325 MG PO TABS: 1.0000 | ORAL_TABLET | ORAL | 0 refills | Status: DC | PRN

## 2016-05-09 MED ORDER — IBUPROFEN 800 MG PO TABS: 800.0000 mg | ORAL_TABLET | Freq: Three times a day (TID) | ORAL | 0 refills | Status: DC | PRN

## 2016-05-09 MED ORDER — KETOROLAC TROMETHAMINE 60 MG/2ML IM SOLN
60.0000 mg | Freq: Once | INTRAMUSCULAR | Status: AC
  Administered 2016-05-09: 60 mg via INTRAMUSCULAR
  Filled 2016-05-09: qty 2

## 2016-05-09 NOTE — ED Provider Notes (Signed)
[facility] Emergency Department Provider Note  ____________________________________________  Time seen: Approximately 3:02 PM  I have reviewed the triage vital signs and the nursing notes.   HISTORY  Chief Complaint Knee Pain (Pt has pins and screws in right knee with swelling and pain.)    HPI Molly Fernandez is a 40 y.o. female who presents for evaluation of right knee pain. Patient reports that she's been having pains on and off for several years since her previous surgery. Patient reports having rods and pins in her knee. States the pain is worse today. She reports being at work at [employer] on her feet all day when the pain gets worse.    Past Medical History:  Diagnosis Date  . Hypertension   . Migraine   . Migraine     There are no active problems to display for this patient.   Past Surgical History:  Procedure Laterality Date  . BREAST LUMPECTOMY Left 1996  . LEG SURGERY Right     Prior to Admission medications   Medication Sig Start Date End Date Taking? Authorizing Provider  HYDROcodone-acetaminophen (NORCO) 5-325 MG tablet Take 1-2 tablets by mouth every 4 (four) hours as needed for moderate pain. 05/09/16   Charmayne Sheerharles M Joci Dress, PA-C  ibuprofen (ADVIL,MOTRIN) 800 MG tablet Take 1 tablet (800 mg total) by mouth every 8 (eight) hours as needed. 05/09/16   Charmayne Sheerharles M Arion Morgan, PA-C  metoCLOPramide (REGLAN) 10 MG tablet Take 1 tablet (10 mg total) by mouth every 8 (eight) hours as needed for nausea or vomiting. Patient not taking: Reported on 11/14/2015 02/19/15   Emily FilbertJonathan E Williams, MD  metroNIDAZOLE (FLAGYL) 500 MG tablet Take 1 tablet (500 mg total) by mouth 2 (two) times daily. Patient not taking: Reported on 11/14/2015 03/24/15   Sharman CheekPhillip Stafford, MD  nitrofurantoin (MACRODANTIN) 100 MG capsule Take 1 capsule (100 mg total) by mouth 2 (two) times daily. Patient not taking: Reported on 11/14/2015 03/24/15   Sharman CheekPhillip Stafford, MD  ondansetron (ZOFRAN  ODT) 8 MG disintegrating tablet Take 1 tablet (8 mg total) by mouth every 8 (eight) hours as needed for nausea or vomiting. Patient not taking: Reported on 11/14/2015 03/24/15   Sharman CheekPhillip Stafford, MD  prochlorperazine (COMPAZINE) 10 MG tablet Take 1 tablet (10 mg total) by mouth every 6 (six) hours as needed for nausea. 11/15/15   Irean HongJade J Sung, MD  traMADol (ULTRAM) 50 MG tablet Take 1 tablet (50 mg total) by mouth every 8 (eight) hours as needed (Do not drive or operate machinery while taking as can cause drowsiness.). Patient not taking: Reported on 11/14/2015 03/29/15   Renford DillsLindsey Miller, NP    Allergies Penicillins  No family history on file.  Social History Social History  Substance Use Topics  . Smoking status: Current Every Day Smoker    Packs/day: 0.50    Types: Cigarettes  . Smokeless tobacco: Never Used  . Alcohol use Yes     Comment: Socially    Review of Systems Constitutional: No fever/chills Musculoskeletal: Positive for right knee pain. Skin: Negative for rash. Neurological: Negative for headaches, focal weakness or numbness.  10-point ROS otherwise negative.  ____________________________________________   PHYSICAL EXAM:  VITAL SIGNS: ED Triage Vitals  Enc Vitals Group     BP 05/09/16 1438 (!) 164/123     Pulse Rate 05/09/16 1438 77     Resp 05/09/16 1438 16     Temp 05/09/16 1438 98.3 F (36.8 C)     Temp Source  05/09/16 1438 Oral     SpO2 05/09/16 1438 99 %     Weight 05/09/16 1438 180 lb (81.6 kg)     Height 05/09/16 1438 5\' 3"  (1.6 m)     Head Circumference --      Peak Flow --      Pain Score 05/09/16 1439 8     Pain Loc --      Pain Edu? --      Excl. in GC? --     Constitutional: Alert and oriented. Well appearing and in no acute distress. Cardiovascular: Normal rate, regular rhythm. Grossly normal heart sounds.  Good peripheral circulation. Respiratory: Normal respiratory effort.  No retractions. Lungs CTAB. Musculoskeletal: As a right knee  tenderness and edema. No ecchymosis or bruising noted. Neurologic:  Normal speech and language. No gross focal neurologic deficits are appreciated.  Skin:  Skin is warm, dry and intact. No rash noted. Psychiatric: Mood and affect are normal. Speech and behavior are normal.  ____________________________________________   LABS (all labs ordered are listed, but only abnormal results are displayed)  Labs Reviewed - No data to display ____________________________________________  EKG   ____________________________________________  RADIOLOGY   ____________________________________________   PROCEDURES  Procedure(s) performed: None  Critical Care performed: No  ____________________________________________   INITIAL IMPRESSION / ASSESSMENT AND PLAN / ED COURSE  Pertinent labs & imaging results that were available during my care of the patient were reviewed by me and considered in my medical decision making (see chart for details). Review of the [place] CSRS was performed in accordance of the [facility] prior to dispensing Molly controlled drugs.  Acute exacerbation of chronic right knee pain. Hypertension. Discussed all clinical radiological findings with the patient Rx given for ibuprofen 800 mg 3 times a day, Tigan 5/325. Work excuse 48 hours given. Patient voices no other emergency medical complaints this time.  Clinical Course    ____________________________________________   FINAL CLINICAL IMPRESSION(S) / ED DIAGNOSES  Final diagnoses:  Right knee pain     This chart was dictated using voice recognition software/Dragon. Despite best efforts to proofread, errors can occur which can change the meaning. Molly change was purely unintentional.    Evangeline Dakin, PA-C 05/09/16 1542    Molly Eddy, MD 05/10/16 1055
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
## 2016-11-23 ENCOUNTER — Emergency Department: Payer: BLUE CROSS/BLUE SHIELD

## 2016-11-23 ENCOUNTER — Emergency Department
Admission: EM | Admit: 2016-11-23 | Discharge: 2016-11-23 | Disposition: A | Discharge status: Home / Self Care | Payer: BLUE CROSS/BLUE SHIELD | Attending: Emergency Medicine | Admitting: Emergency Medicine

## 2016-11-23 ENCOUNTER — Encounter: Payer: Self-pay | Admitting: Emergency Medicine

## 2016-11-23 DIAGNOSIS — F17210 Nicotine dependence, cigarettes, uncomplicated: Secondary | ICD-10-CM | POA: Insufficient documentation

## 2016-11-23 DIAGNOSIS — I10 Essential (primary) hypertension: Secondary | ICD-10-CM | POA: Diagnosis not present

## 2016-11-23 DIAGNOSIS — R071 Chest pain on breathing: Secondary | ICD-10-CM | POA: Diagnosis present

## 2016-11-23 DIAGNOSIS — R0781 Pleurodynia: Secondary | ICD-10-CM

## 2016-11-23 DIAGNOSIS — R0789 Other chest pain: Secondary | ICD-10-CM

## 2016-11-23 LAB — BASIC METABOLIC PANEL
ANION GAP: 7 (ref 5–15)
BUN: 11 mg/dL (ref 6–20)
CHLORIDE: 106 mmol/L (ref 101–111)
CO2: 24 mmol/L (ref 22–32)
Calcium: 8.7 mg/dL — ABNORMAL LOW (ref 8.9–10.3)
Creatinine, Ser: 0.81 mg/dL (ref 0.44–1.00)
GFR calc Af Amer: >60 mL/min (ref >60)
GLUCOSE: 96 mg/dL (ref 65–99)
POTASSIUM: 3.3 mmol/L — AB (ref 3.5–5.1)
Sodium: 137 mmol/L (ref 135–145)

## 2016-11-23 LAB — CBC
HEMATOCRIT: 36.9 % (ref 35.0–47.0)
Hemoglobin: 12.5 g/dL (ref 12.0–16.0)
MCH: 28.4 pg (ref 26.0–34.0)
MCHC: 33.9 g/dL (ref 32.0–36.0)
MCV: 83.9 fL (ref 80.0–100.0)
Platelets: 245 10*3/uL (ref 150–440)
RBC: 4.4 MIL/uL (ref 3.80–5.20)
RDW: 15 % — AB (ref 11.5–14.5)
WBC: 9.6 10*3/uL (ref 3.6–11.0)

## 2016-11-23 LAB — TROPONIN I
Troponin I: <0.03 ng/mL (ref <0.03)
Troponin I: <0.03 ng/mL (ref <0.03)

## 2016-11-23 LAB — POCT PREGNANCY, URINE: Preg Test, Ur: NEGATIVE

## 2016-11-23 MED ORDER — OXYCODONE-ACETAMINOPHEN 5-325 MG PO TABS
2.0000 | ORAL_TABLET | ORAL | Status: AC
  Administered 2016-11-23: 2 via ORAL
  Filled 2016-11-23: qty 2

## 2016-11-23 MED ORDER — IOPAMIDOL (ISOVUE-370) INJECTION 76%
75.0000 mL | Freq: Once | INTRAVENOUS | Status: AC | PRN
  Administered 2016-11-23: 75 mL via INTRAVENOUS

## 2016-11-23 NOTE — ED Notes (Signed)
Returned from XR 

## 2016-11-23 NOTE — ED Triage Notes (Signed)
Pt arrived via EMS from work at [employer]. Pt states she started having chest pain this morning with dizziness. Pt states they took her BP and it was elevated in the 200s systolic.  EMS reports 190s/123.  Has hx of HTN and Migraines.  Had a headache yesterday but no migraine.  Took BP meds today. EMS gave 324 ASA enroute as well as 1 spray of NITRO.  Pt reports NITRO did help.  Pt c/o chills as well as shortness of breath.

## 2016-11-23 NOTE — Discharge Instructions (Signed)

## 2016-11-23 NOTE — ED Provider Notes (Signed)
[facility] Emergency Department Provider Note   ____________________________________________   First MD Initiated Contact with Patient 11/23/16 1045     (approximate)  I have reviewed the triage vital signs and the nursing notes.   HISTORY  Chief Complaint Chest Pain    HPI Molly Fernandez is a 41 y.o. female history of hypertension. This morning when she woke up she began experiencing a slight sharp pain in her left upper chest, seemed to get somewhat worse throughout the day and worse with movement but not with walking. Denies any heavy chest pressure. Reports a moderate sharp discomfort in the left upper chest. Somewhat worse with movement and pushing on it. No nausea vomiting or shortness of breath.  Denies any personal history of heart disease.  No fevers chills or cough.  She has noticed some slight swelling in her ankles over the last 2 weeks, worse while at work and better in the mornings. Orthopedic surgery on the right knee, but not recently  Past Medical History:  Diagnosis Date  . Hypertension   . Migraine   . Migraine     There are no active problems to display for this patient.   Past Surgical History:  Procedure Laterality Date  . BREAST LUMPECTOMY Left 1996  . LEG SURGERY Right     Prior to Admission medications   Medication Sig Start Date End Date Taking? Authorizing Provider  HYDROcodone-acetaminophen (NORCO) 5-325 MG tablet Take 1-2 tablets by mouth every 4 (four) hours as needed for moderate pain. 05/09/16   Charmayne Sheer Beers, PA-C  ibuprofen (ADVIL,MOTRIN) 800 MG tablet Take 1 tablet (800 mg total) by mouth every 8 (eight) hours as needed. 05/09/16   Charmayne Sheer Beers, PA-C  metoCLOPramide (REGLAN) 10 MG tablet Take 1 tablet (10 mg total) by mouth every 8 (eight) hours as needed for nausea or vomiting. Patient not taking: Reported on 11/14/2015 02/19/15   Emily Filbert, MD  metroNIDAZOLE (FLAGYL) 500 MG tablet Take 1  tablet (500 mg total) by mouth 2 (two) times daily. Patient not taking: Reported on 11/14/2015 03/24/15   Sharman Cheek, MD  nitrofurantoin (MACRODANTIN) 100 MG capsule Take 1 capsule (100 mg total) by mouth 2 (two) times daily. Patient not taking: Reported on 11/14/2015 03/24/15   Sharman Cheek, MD  ondansetron (ZOFRAN ODT) 8 MG disintegrating tablet Take 1 tablet (8 mg total) by mouth every 8 (eight) hours as needed for nausea or vomiting. Patient not taking: Reported on 11/14/2015 03/24/15   Sharman Cheek, MD  prochlorperazine (COMPAZINE) 10 MG tablet Take 1 tablet (10 mg total) by mouth every 6 (six) hours as needed for nausea. 11/15/15   Irean Hong, MD  traMADol (ULTRAM) 50 MG tablet Take 1 tablet (50 mg total) by mouth every 8 (eight) hours as needed (Do not drive or operate machinery while taking as can cause drowsiness.). Patient not taking: Reported on 11/14/2015 03/29/15   Renford Dills, NP    Allergies Penicillins  History reviewed. No pertinent family history.  Social History Social History  Substance Use Topics  . Smoking status: Current Every Day Smoker    Packs/day: 0.50    Types: Cigarettes  . Smokeless tobacco: Never Used  . Alcohol use Yes     Comment: Socially    Review of Systems Constitutional: No fever/chills Eyes: No visual changes. ENT: No sore throat. Cardiovascular: See history of present illness Respiratory: Denies shortness of breath. Gastrointestinal: No abdominal pain.  No nausea, no vomiting.  Genitourinary: Negative for dysuria. Denies pregnancy. Musculoskeletal: Negative for back pain. Skin: Negative for rash. Neurological: Negative for headaches, focal weakness or numbness.  10-point ROS otherwise negative.  ____________________________________________   PHYSICAL EXAM:  VITAL SIGNS: ED Triage Vitals  Enc Vitals Group     BP 11/23/16 1006 (!) 148/98     Pulse Rate 11/23/16 1006 90     Resp 11/23/16 1006 (!) 30     Temp 11/23/16 1006  97.3 F (36.3 C)     Temp Source 11/23/16 1006 Oral     SpO2 11/23/16 1003 99 %     Weight 11/23/16 1006 178 lb (80.7 kg)     Height 11/23/16 1006  (1.575 m)     Head Circumference --      Peak Flow --      Pain Score 11/23/16 1005 8     Pain Loc --      Pain Edu? --      Excl. in GC? --     Constitutional: Alert and oriented. Well appearing and in no acute distress.She and her family are all very pleasant. Eyes: Conjunctivae are normal. PERRL. EOMI. Head: Atraumatic. Nose: No congestion/rhinnorhea. Mouth/Throat: Mucous membranes are moist.  Oropharynx non-erythematous. Neck: No stridor.   Cardiovascular: Normal rate, regular rhythm. Grossly normal heart sounds.  Good peripheral circulation. Respiratory: Normal respiratory effort.  No retractions. Lungs CTAB. Gastrointestinal: Soft and nontender. No distention.  Musculoskeletal: No lower extremity tenderness nor edema except for some minimal ankle edema.  Neurologic:  Normal speech and language. No gross focal neurologic deficits are appreciated.  Skin:  Skin is warm, dry and intact. No rash noted. Psychiatric: Mood and affect are normal. Speech and behavior are normal.  ____________________________________________   LABS (all labs ordered are listed, but only abnormal results are displayed)  Labs Reviewed  BASIC METABOLIC PANEL - Abnormal; Notable for the following:       Result Value   Potassium 3.3 (*)    Calcium 8.7 (*)    All other components within normal limits  CBC - Abnormal; Notable for the following:    RDW 15.0 (*)    All other components within normal limits  TROPONIN I  TROPONIN I  POC URINE PREG, ED  POCT PREGNANCY, URINE   ____________________________________________  EKG  ED ECG REPORT I, Kimiyo Carmicheal, the attending physician, personally viewed and interpreted this ECG.  Date: 11/23/2016 EKG Time: 10 AM Rate: 90 Rhythm: normal sinus rhythm QRS Axis: normal Intervals: normal ST/T Wave  abnormalities: normal Conduction Disturbances: none Narrative Interpretation: unremarkable  ____________________________________________  RADIOLOGY  Dg Chest 2 View  Result Date: 11/23/2016 CLINICAL DATA:  Chest pain.  Dyspnea. EXAM: CHEST  2 VIEW COMPARISON:  01/20/2015 chest radiograph. FINDINGS: Stable cardiomediastinal silhouette with normal heart size. No pneumothorax. No pleural effusion. Lungs appear clear, with no acute consolidative airspace disease and no pulmonary edema. IMPRESSION: No active cardiopulmonary disease. Electronically Signed   By: Delbert Phenix M.D.   On: 11/23/2016 10:29   Ct Angio Chest Pe W Or Wo Contrast  Result Date: 11/23/2016 CLINICAL DATA:  Pleuritic chest pain and dizziness since 0400 hours, shortness of breath, smoker, hypertension EXAM: CT ANGIOGRAPHY CHEST WITH CONTRAST TECHNIQUE: Multidetector CT imaging of the chest was performed using the standard protocol during bolus administration of intravenous contrast. Multiplanar CT image reconstructions and MIPs were obtained to evaluate the vascular anatomy. CONTRAST:  75 cc Isovue 370 IV COMPARISON:  None FINDINGS: Cardiovascular: Aorta normal caliber  without aneurysm or dissection. LEFT superior intercostal vein incidentally noted. Pulmonary arteries adequately opacified and patent. No evidence of pulmonary embolism. Minimal pericardial fluid. Mediastinum/Nodes: Base of cervical region unremarkable. Esophagus normal appearance. No thoracic adenopathy. Lungs/Pleura: Calcified granuloma RIGHT lower lobe image 41. Lungs otherwise clear. No pulmonary infiltrate or pleural effusion pneumothorax. Upper Abdomen: Unremarkable Musculoskeletal: No acute osseous findings. Review of the MIP images confirms the above findings. IMPRESSION: No evidence of pulmonary embolism. No significant intrathoracic abnormalities Electronically Signed   By: Ulyses Southward M.D.   On: 11/23/2016 13:24   US Venous Img Lower Bilateral  Result Date:  11/23/2016 CLINICAL DATA:  Pedal edema, chest pain x2 months, previous tobacco abuse EXAM: BILATERAL LOWER EXTREMITY VENOUS DOPPLER ULTRASOUND TECHNIQUE: Gray-scale sonography with compression, as well as color and duplex ultrasound, were performed to evaluate the deep venous system from the level of the common femoral vein through the popliteal and proximal calf veins. COMPARISON:  None FINDINGS: Normal compressibility of the common femoral, superficial femoral, and popliteal veins, as well as the proximal calf veins. No filling defects to suggest DVT on grayscale or color Doppler imaging. Doppler waveforms show normal direction of venous flow, normal respiratory phasicity and response to augmentation. IMPRESSION: No evidence of  lower extremity deep vein thrombosis. Electronically Signed   By: Corlis Leak M.D.   On: 11/23/2016 12:13    ____________________________________________   PROCEDURES  Procedure(s) performed: None  Procedures  Critical Care performed: No  ____________________________________________   INITIAL IMPRESSION / ASSESSMENT AND PLAN / ED COURSE  Pertinent labs & imaging results that were available during my care of the patient were reviewed by me and considered in my medical decision making (see chart for details).  Patient presents for evaluation of chest pain. No significant risk factors for coronary disease noted, aside from history of hypertension. No strong family history. No diabetes. Nonsmoker.  Chest pain is atypical in nature, sharp somewhat pleuritic involving left upper chest localizable by palpation along the portion of pectoralis muscle. Also worsened by use of left arm. No ripping tearing or moving pain. Does have a history of previous orthopedic surgery, and reports she's had some trace edema in her ankles for the last few weeks. Given the pleuritic nature pain and report of some mild edema TO CT scan to exclude pulmonary embolism.  Overall my pretest  probability for coronary disease is low, her heart score is also low.  Clinical Course as of Nov 24 1531  Sun Nov 23, 2016  1418 Reexamined patient. Currently reports pain free. Resting comfortably.  [MQ]    Clinical Course User Index [MQ] Sharyn Creamer, MD   ----------------------------------------- 3:38 PM on 11/23/2016 -----------------------------------------  Patient pain-free. Observed for several hours with repeat troponin normal. Low risk, discussed with patient and also discussed with Dr. Cassie Freer of cardiology. She will be discharged, calling the cardiology clinic for follow-up this week.  Return precautions and treatment recommendations and follow-up discussed with the patient who is agreeable with the plan.   ____________________________________________   FINAL CLINICAL IMPRESSION(S) / ED DIAGNOSES  Final diagnoses:  Pleuritic chest pain  Atypical chest pain      NEW MEDICATIONS STARTED DURING THIS VISIT:  New Prescriptions   No medications on file     Note:  This document was prepared using Dragon voice recognition software and may include unintentional dictation errors.     Sharyn Creamer, MD 11/23/16 [phone]

## 2016-11-24 NOTE — ED Notes (Addendum)
Pt given work note on 11/24/16 at 1257.

## 2017-01-21 ENCOUNTER — Emergency Department
Admission: EM | Admit: 2017-01-21 | Discharge: 2017-01-21 | Disposition: A | Discharge status: Home / Self Care | Payer: BLUE CROSS/BLUE SHIELD | Attending: Emergency Medicine | Admitting: Emergency Medicine

## 2017-01-21 ENCOUNTER — Encounter: Payer: Self-pay | Admitting: *Deleted

## 2017-01-21 DIAGNOSIS — K0889 Other specified disorders of teeth and supporting structures: Secondary | ICD-10-CM

## 2017-01-21 DIAGNOSIS — H9201 Otalgia, right ear: Secondary | ICD-10-CM | POA: Diagnosis not present

## 2017-01-21 DIAGNOSIS — I10 Essential (primary) hypertension: Secondary | ICD-10-CM | POA: Diagnosis not present

## 2017-01-21 DIAGNOSIS — F17210 Nicotine dependence, cigarettes, uncomplicated: Secondary | ICD-10-CM | POA: Diagnosis not present

## 2017-01-21 DIAGNOSIS — Z79899 Other long term (current) drug therapy: Secondary | ICD-10-CM | POA: Diagnosis not present

## 2017-01-21 MED ORDER — TRAMADOL HCL 50 MG PO TABS: 50.0000 mg | ORAL_TABLET | Freq: Four times a day (QID) | ORAL | 0 refills | Status: DC | PRN

## 2017-01-21 MED ORDER — CLINDAMYCIN HCL 300 MG PO CAPS: 300.0000 mg | ORAL_CAPSULE | Freq: Three times a day (TID) | ORAL | 0 refills | Status: AC

## 2017-01-21 NOTE — ED Provider Notes (Signed)
[facility] Emergency Department Provider Note ____________________________________________  Time seen: Approximately 8:32 AM  I have reviewed the triage vital signs and the nursing notes.   HISTORY  Chief Complaint Otalgia    HPI Molly Fernandez is a 41 y.o. female who presents to the emergency department for evaluation of right ear pain that has been present off-and-on for the past 2 months. She has taken Tylenol and ibuprofen without relief. She has not been evaluated since onset of pain. Nothing seems to make the pain better or worse. She has noticed some intermittent drainage. No changes in hearing.  Past Medical History:  Diagnosis Date  . Hypertension   . Migraine   . Migraine     There are no active problems to display for this patient.   Past Surgical History:  Procedure Laterality Date  . BREAST LUMPECTOMY Left 1996  . LEG SURGERY Right     Prior to Admission medications   Medication Sig Start Date End Date Taking? Authorizing Provider  hydrochlorothiazide (HYDRODIURIL) 25 MG tablet Take 25 mg by mouth daily.   Yes [provider]  lisinopril (PRINIVIL,ZESTRIL) 20 MG tablet Take 20 mg by mouth daily.   Yes [provider]  clindamycin (CLEOCIN) 300 MG capsule Take 1 capsule (300 mg total) by mouth 3 (three) times daily. 01/21/17 01/31/17  Ermon Sagan, Rulon Eisenmengerari B, FNP  ibuprofen (ADVIL,MOTRIN) 800 MG tablet Take 1 tablet (800 mg total) by mouth every 8 (eight) hours as needed. 05/09/16   Beers, Charmayne Sheerharles M, PA-C  metoCLOPramide (REGLAN) 10 MG tablet Take 1 tablet (10 mg total) by mouth every 8 (eight) hours as needed for nausea or vomiting. Patient not taking: Reported on 11/14/2015 02/19/15   Emily FilbertWilliams, Jonathan E, MD  metroNIDAZOLE (FLAGYL) 500 MG tablet Take 1 tablet (500 mg total) by mouth 2 (two) times daily. Patient not taking: Reported on 11/14/2015 03/24/15   Sharman CheekStafford, Phillip, MD  nitrofurantoin (MACRODANTIN) 100 MG capsule Take 1  capsule (100 mg total) by mouth 2 (two) times daily. Patient not taking: Reported on 11/14/2015 03/24/15   Sharman CheekStafford, Phillip, MD  ondansetron [facility](ZOFRAN ODT) 8 MG disintegrating tablet Take 1 tablet (8 mg total) by mouth every 8 (eight) hours as needed for nausea or vomiting. Patient not taking: Reported on 11/14/2015 03/24/15   Sharman CheekStafford, Phillip, MD  prochlorperazine (COMPAZINE) 10 MG tablet Take 1 tablet (10 mg total) by mouth every 6 (six) hours as needed for nausea. 11/15/15   Irean HongSung, Jade J, MD  traMADol (ULTRAM) 50 MG tablet Take 1 tablet (50 mg total) by mouth every 6 (six) hours as needed. 01/21/17   Chinita Pesterriplett, Brianah Hopson B, FNP    Allergies Penicillins  History reviewed. No pertinent family history.  Social History Social History  Substance Use Topics  . Smoking status: Current Every Day Smoker    Packs/day: 0.50    Types: Cigarettes  . Smokeless tobacco: Never Used  . Alcohol use Yes     Comment: Socially    Review of Systems Constitutional: Well appearing. Eyes: Negative for conjunctival erythema or drainage ENT: Positive for ear pain, positive for dental pain.  Gastrointestinal: Negative for nausea, vomiting, diarrhea. Musculoskeletal: Negative for bodyaches Skin: Negative for rash, lesion, 1. Neurological: Negative for change in sensation. ____________________________________________   PHYSICAL EXAM:  VITAL SIGNS: ED Triage Vitals  Enc Vitals Group     BP 01/21/17 0820 (!) 149/100     Pulse Rate 01/21/17 0820 84     Resp 01/21/17 0820 20  Temp 01/21/17 0820 98.4 F (36.9 C)     Temp Source 01/21/17 0820 Oral     SpO2 01/21/17 0820 99 %     Weight 01/21/17 0818 180 lb (81.6 kg)     Height 01/21/17 0818 5\' 2"  (1.575 m)     Head Circumference --      Peak Flow --      Pain Score 01/21/17 0817 9     Pain Loc --      Pain Edu? --      Excl. in GC? --     Constitutional: Well appearing. Afebrile. Ears: Right tympanic membrane clear without evidence of infection. Left  tympanic membrane is also clear without evidence of infection.  Head: Atraumatic Nose: No sinus congestion or drainage Mouth/Throat: Widespread dental decay throughout. Hematological/Lymphatic/Immunilogical: Anterior cervical lymphadenopathy on the right side Respiratory: Respirations even and unlabored.  Neurologic:  Alert, oriented 4. Skin: Warm, dry, intact. ____________________________________________   LABS (all labs ordered are listed, but only abnormal results are displayed)  Labs Reviewed - No data to display ____________________________________________   RADIOLOGY  Not indicated ____________________________________________   PROCEDURES  Procedure(s) performed: None  ____________________________________________   INITIAL IMPRESSION / ASSESSMENT AND PLAN / ED COURSE  41 year old female presenting to the emergency department for evaluation of otalgia on the right side. No evidence of otitis media. Pain likely originating from widespread dental decay. There is a chronic fracture of a molar on the right upper that may explain the radiation of pain into her ear. She will be treated with clindamycin and tramadol and advised to follow up with a dentist as soon as possible. She is to return to the ER for symptoms that change or worsen or for new concerns if unable to schedule an appointment with primary care or dentist.  Pertinent labs & imaging results that were available during my care of the patient were reviewed by me and considered in my medical decision making (see chart for details). ____________________________________________   FINAL CLINICAL IMPRESSION(S) / ED DIAGNOSES  Final diagnoses:  Otalgia of right ear  Pain, dental    Discharge Medication List as of 01/21/2017  8:47 AM    START taking these medications   Details  clindamycin (CLEOCIN) 300 MG capsule Take 1 capsule (300 mg total) by mouth 3 (three) times daily., Starting Wed 01/21/2017, Until Sat  01/31/2017, Print        If controlled substance prescribed during this visit, 12 month history viewed on the [facility] prior to issuing an initial prescription for Schedule II or III opiod.   Note:  This document was prepared using Dragon voice recognition software and may include unintentional dictation errors.     Chinita Pester, FNP 01/21/17 1224    Minna Antis, MD 01/21/17 1426

## 2017-01-21 NOTE — Discharge Instructions (Signed)
Please call and schedule a dental appointment as soon as possible. You will need to be seen within the next 14 days. Return to the emergency department for symptoms that change or worsen if you're unable to schedule an appointment.  OPTIONS FOR DENTAL FOLLOW UP CARE  [place] Department of Health and Human Services - Local Safety Net Dental Clinics http://www.ncdhhs.gov/dph/oralhealth/services/safetynetclinics.htm   [facility] ([phone])  [facility] ([phone])  [facility] ([phone])  [facility] ([phone])  [facility] ([phone]) This clinic caters to the indigent population and is on a lottery system. Location: [facility], [address], [place] Clinic Hours: Wednesdays from 6pm - 9pm, patients seen by a lottery system. For dates, call or go to www.med.[facility].edu/[facility]/patients/Dental-[facility] Services: Cleanings, fillings and simple extractions. Payment Options: DENTAL WORK IS FREE OF CHARGE. Bring proof of income or support. Best way to get seen: Arrive at 5:15 pm - this is a lottery, NOT first come/first serve, so arriving earlier will not increase your chances of being seen.     [facility] [phone] Select option 1 for emergencies   Location: [facility], [address], [place] Clinic Hours: No walk-ins accepted - call the day before to schedule an appointment. Check in times are 9:30 am and 1:30 pm. Services: Simple extractions, temporary fillings, pulpectomy/pulp debridement, uncomplicated abscess drainage. Payment Options: PAYMENT IS DUE AT THE TIME OF SERVICE.  Fee is usually $100-200, additional surgical procedures (e.g. abscess drainage) may be extra. Cash, checks, Visa/MasterCard accepted.  Can file Medicaid if patient is covered for dental - patient should call case worker to check. No  discount for [facility] patients. Best way to get seen: MUST call the day before and get onto the schedule. Can usually be seen the next 1-2 days. No walk-ins accepted.     [facility] [phone]   Location: [facility], [address], [place] Clinic Hours: M, W, Th, F 8am or 1:30pm, Tues 9a or 1:30 - first come/first served. Services: Simple extractions, temporary fillings, uncomplicated abscess drainage.  You do not need to be an [place] resident. Payment Options: PAYMENT IS DUE AT THE TIME OF SERVICE. Dental insurance, otherwise sliding scale - bring proof of income or support. Depending on income and treatment needed, cost is usually $50-200. Best way to get seen: Arrive early as it is first come/first served.     [facility] [phone]   Location: [address] Clinic Hours: Mon-Thu 8a-5p Services: Most basic dental services including extractions and fillings. Payment Options: PAYMENT IS DUE AT THE TIME OF SERVICE. Sliding scale, up to 50% off - bring proof if income or support. Medicaid with dental option accepted. Best way to get seen: Call to schedule an appointment, can usually be seen within 2 weeks OR they will try to see walk-ins - show up at 8a or 2p (you may have to wait).     [facility] [phone] [place] RESIDENTS ONLY   Location: [facility], [address], [place], [place] [address] Clinic Hours: By appointment only. Monday - Thursday 8am-5pm, Friday 8am-12pm Services: Cleanings, fillings, extractions. Payment Options: PAYMENT IS DUE AT THE TIME OF SERVICE. Cash, Visa or MasterCard. Sliding scale - $30 minimum per service. Best way to get seen: Come in to office, complete packet and make an appointment -   need proof of income or support monies for each household member and proof of [place]  residence. Usually takes about a month to get in.     [facility] [phone]   Location: [address] [place] Clinic Hours: Walk-in Urgent Care Dental Services are offered Monday-Friday mornings only. The numbers of emergencies accepted daily is limited to the number of providers available. Maximum 15 - Mondays, Wednesdays & Thursdays Maximum 10 - Tuesdays & Fridays Services: You do not need to be a [place] resident to be seen for a dental emergency. Emergencies are defined as pain, swelling, abnormal bleeding, or dental trauma. Walkins will receive x-rays if needed. NOTE: Dental cleaning is not an emergency. Payment Options: PAYMENT IS DUE AT THE TIME OF SERVICE. Minimum co-pay is $40.00 for uninsured patients. Minimum co-pay is $3.00 for Medicaid with dental coverage. Dental Insurance is accepted and must be presented at time of visit. Medicare does not cover dental. Forms of payment: Cash, credit card, checks. Best way to get seen: If not previously registered with the clinic, walk-in dental registration begins at 7:15 am and is on a first come/first serve basis. If previously registered with the clinic, call to make an appointment.     [facility] [phone] [place] RESIDENTS ONLY   Location: [address], [place], [place] Clinic Hours: Mon-Thu 10a-2p Services: Extractions only! Payment Options: FREE (donations accepted) - bring proof of income or support Best way to get seen: Call and schedule an appointment OR come at 8am on the 1st Monday of every month (except for holidays) when it is first come/first served.     [facility] [phone]   Location: [address], [place] Clinic Hours: Friday mornings Services, Payment Options, Best way to get seen: Call for info  

## 2017-01-21 NOTE — ED Notes (Signed)
See triage note  States she noticed some swelling of her lymph nodes about 1 1/2 months ago with occasional fever  States pain is moving up into right ear

## 2017-01-21 NOTE — ED Triage Notes (Signed)
States right ear pain that goes down her throat, states pain for 2 months, states she feels as if her lymph nodes are swollen in the right side of her neck, awake and alert in no acute distress

## 2019-02-11 ENCOUNTER — Emergency Department
Admission: EM | Admit: 2019-02-11 | Discharge: 2019-02-11 | Disposition: A | Discharge status: Home / Self Care | Payer: Self-pay | Attending: Student in an Organized Health Care Education/Training Program | Admitting: Student in an Organized Health Care Education/Training Program

## 2019-02-11 ENCOUNTER — Other Ambulatory Visit: Payer: Self-pay

## 2019-02-11 ENCOUNTER — Encounter: Payer: Self-pay | Admitting: Emergency Medicine

## 2019-02-11 ENCOUNTER — Emergency Department: Payer: Self-pay

## 2019-02-11 DIAGNOSIS — I10 Essential (primary) hypertension: Secondary | ICD-10-CM | POA: Insufficient documentation

## 2019-02-11 DIAGNOSIS — F17210 Nicotine dependence, cigarettes, uncomplicated: Secondary | ICD-10-CM | POA: Insufficient documentation

## 2019-02-11 DIAGNOSIS — Z79899 Other long term (current) drug therapy: Secondary | ICD-10-CM | POA: Insufficient documentation

## 2019-02-11 DIAGNOSIS — R0789 Other chest pain: Secondary | ICD-10-CM | POA: Insufficient documentation

## 2019-02-11 LAB — BASIC METABOLIC PANEL
Anion gap: 7 (ref 5–15)
BUN: 12 mg/dL (ref 6–20)
CO2: 24 mmol/L (ref 22–32)
Calcium: 8.8 mg/dL — ABNORMAL LOW (ref 8.9–10.3)
Chloride: 107 mmol/L (ref 98–111)
Creatinine, Ser: 0.85 mg/dL (ref 0.44–1.00)
GFR calc Af Amer: >60 mL/min (ref >60)
GFR calc non Af Amer: >60 mL/min (ref >60)
Glucose, Bld: 105 mg/dL — ABNORMAL HIGH (ref 70–99)
Potassium: 3.4 mmol/L — ABNORMAL LOW (ref 3.5–5.1)
Sodium: 138 mmol/L (ref 135–145)

## 2019-02-11 LAB — CBC
HCT: 40.9 % (ref 36.0–46.0)
Hemoglobin: 13.2 g/dL (ref 12.0–15.0)
MCH: 27.6 pg (ref 26.0–34.0)
MCHC: 32.3 g/dL (ref 30.0–36.0)
MCV: 85.4 fL (ref 80.0–100.0)
Platelets: 256 10*3/uL (ref 150–400)
RBC: 4.79 MIL/uL (ref 3.87–5.11)
RDW: 14.6 % (ref 11.5–15.5)
WBC: 10.1 10*3/uL (ref 4.0–10.5)
nRBC: 0 % (ref 0.0–0.2)

## 2019-02-11 LAB — TROPONIN I: Troponin I: <0.03 ng/mL (ref <0.03)

## 2019-02-11 MED ORDER — PREDNISONE 20 MG PO TABS: 40.0000 mg | ORAL_TABLET | Freq: Every day | ORAL | 0 refills | Status: AC

## 2019-02-11 MED ORDER — SODIUM CHLORIDE 0.9% FLUSH: 3.0000 mL | Freq: Once | INTRAVENOUS | Status: DC

## 2019-02-11 MED ORDER — SUCRALFATE 1 G PO TABS: 1.0000 g | ORAL_TABLET | Freq: Once | ORAL | Status: DC

## 2019-02-11 MED ORDER — LORAZEPAM 1 MG PO TABS
1.0000 mg | ORAL_TABLET | Freq: Once | ORAL | Status: AC
  Administered 2019-02-11: 13:00:00 1 mg via ORAL
  Filled 2019-02-11: qty 1

## 2019-02-11 MED ORDER — ALBUTEROL SULFATE HFA 108 (90 BASE) MCG/ACT IN AERS: 2.0000 | INHALATION_SPRAY | Freq: Four times a day (QID) | RESPIRATORY_TRACT | 1 refills | Status: DC | PRN

## 2019-02-11 NOTE — ED Provider Notes (Signed)
[facility] Emergency Department Provider Note    First MD Initiated Contact with Patient 02/11/19 1140     (approximate)  I have reviewed the triage vital signs and the nursing notes.   HISTORY  Chief Complaint Chest Pain    HPI Molly Fernandez is a 43 y.o. female [place] past medical history presents the ER for evaluation of chest pain and pressure that started on Saturday.  States that she is had similar pain in the past.  States that this time is been constant.  There is been no change with movement or exertion.  States the pain is somewhat reproducible with palpation of her chest.  Denies any pain radiating through to her back or up into her jaw.  Denies any numbness or tingling.  No fevers.  No cough or congestion.  States that sometimes she does have heartburn symptoms particular she is laying flat.  Denies any history of heart disease.  She is not any birth control.  She does smoke.  Does take lisinopril for blood pressure.  That she is been very stressed out with work this week.    Past Medical History:  Diagnosis Date  . Hypertension   . Migraine   . Migraine    No family history on file. Past Surgical History:  Procedure Laterality Date  . BREAST LUMPECTOMY Left 1996  . LEG SURGERY Right    There are no active problems to display for this patient.     Prior to Admission medications   Medication Sig Start Date End Date Taking? Authorizing Provider  hydrochlorothiazide (HYDRODIURIL) 25 MG tablet Take 25 mg by mouth daily.    [provider]  ibuprofen (ADVIL,MOTRIN) 800 MG tablet Take 1 tablet (800 mg total) by mouth every 8 (eight) hours as needed. 05/09/16   Beers, Charmayne Sheerharles M, PA-C  lisinopril (PRINIVIL,ZESTRIL) 20 MG tablet Take 20 mg by mouth daily.    [provider]  metoCLOPramide (REGLAN) 10 MG tablet Take 1 tablet (10 mg total) by mouth every 8 (eight) hours as needed for nausea or vomiting. Patient not taking:  Reported on 11/14/2015 02/19/15   Emily FilbertWilliams, Jonathan E, MD  metroNIDAZOLE (FLAGYL) 500 MG tablet Take 1 tablet (500 mg total) by mouth 2 (two) times daily. Patient not taking: Reported on 11/14/2015 03/24/15   Sharman CheekStafford, Phillip, MD  nitrofurantoin (MACRODANTIN) 100 MG capsule Take 1 capsule (100 mg total) by mouth 2 (two) times daily. Patient not taking: Reported on 11/14/2015 03/24/15   Sharman CheekStafford, Phillip, MD  ondansetron [facility](ZOFRAN ODT) 8 MG disintegrating tablet Take 1 tablet (8 mg total) by mouth every 8 (eight) hours as needed for nausea or vomiting. Patient not taking: Reported on 11/14/2015 03/24/15   Sharman CheekStafford, Phillip, MD  prochlorperazine (COMPAZINE) 10 MG tablet Take 1 tablet (10 mg total) by mouth every 6 (six) hours as needed for nausea. 11/15/15   Irean HongSung, Jade J, MD  traMADol (ULTRAM) 50 MG tablet Take 1 tablet (50 mg total) by mouth every 6 (six) hours as needed. 01/21/17   Chinita Pesterriplett, Cari B, FNP    Allergies Hydrocodone-acetaminophen and Penicillins    Social History Social History   Tobacco Use  . Smoking status: Current Every Day Smoker    Packs/day: 0.50    Types: Cigarettes  . Smokeless tobacco: Never Used  Substance Use Topics  . Alcohol use: Yes    Comment: Socially  . Drug use: No    Review of Systems Patient denies headaches, rhinorrhea, blurry  vision, numbness, shortness of breath, chest pain, edema, cough, abdominal pain, nausea, vomiting, diarrhea, dysuria, fevers, rashes or hallucinations unless otherwise stated above in HPI. ____________________________________________   PHYSICAL EXAM:  VITAL SIGNS: Vitals:   02/11/19 0937  BP: (!) 168/114  Pulse: 93  Resp: 18  Temp: 98.8 F (37.1 C)  SpO2: 100%    Constitutional: Alert and oriented.  Eyes: Conjunctivae are normal.  Head: Atraumatic. Nose: No congestion/rhinnorhea. Mouth/Throat: Mucous membranes are moist.   Neck: No stridor. Painless ROM.  Cardiovascular: Normal rate, regular rhythm. Grossly normal  heart sounds.  Good peripheral circulation. Respiratory: Normal respiratory effort.  No retractions. Lungs CTAB. Gastrointestinal: Soft and nontender. No distention. No abdominal bruits. No CVA tenderness. Genitourinary:  Musculoskeletal: No lower extremity tenderness nor edema.  No joint effusions. Neurologic:  Normal speech and language. No gross focal neurologic deficits are appreciated. No facial droop Skin:  Skin is warm, dry and intact. No rash noted. Psychiatric: Mood and affect are normal. Speech and behavior are normal.  ____________________________________________   LABS (all labs ordered are listed, but only abnormal results are displayed)  Results for orders placed or performed during the hospital encounter of 02/11/19 (from the past 24 hour(s))  Basic metabolic panel     Status: Abnormal   Collection Time: 02/11/19  9:34 AM  Result Value Ref Range   Sodium 138 135 - 145 mmol/L   Potassium 3.4 (L) 3.5 - 5.1 mmol/L   Chloride 107 98 - 111 mmol/L   CO2 24 22 - 32 mmol/L   Glucose, Bld 105 (H) 70 - 99 mg/dL   BUN 12 6 - 20 mg/dL   Creatinine, Ser 0.85 0.44 - 1.00 mg/dL   Calcium 8.8 (L) 8.9 - 10.3 mg/dL   GFR calc non Af Amer >60 >60 mL/min   GFR calc Af Amer >60 >60 mL/min   Anion gap 7 5 - 15  CBC     Status: None   Collection Time: 02/11/19  9:34 AM  Result Value Ref Range   WBC 10.1 4.0 - 10.5 K/uL   RBC 4.79 3.87 - 5.11 MIL/uL   Hemoglobin 13.2 12.0 - 15.0 g/dL   HCT 40.9 36.0 - 46.0 %   MCV 85.4 80.0 - 100.0 fL   MCH 27.6 26.0 - 34.0 pg   MCHC 32.3 30.0 - 36.0 g/dL   RDW 14.6 11.5 - 15.5 %   Platelets 256 150 - 400 K/uL   nRBC 0.0 0.0 - 0.2 %  Troponin I - ONCE - STAT     Status: None   Collection Time: 02/11/19  9:34 AM  Result Value Ref Range   Troponin I <0.03 <0.03 ng/mL   ____________________________________________  EKG My review and personal interpretation at Time: 9:22   Indication: chest pain  Rate: 85  Rhythm: sinus Axis: normal Other:  normal intervals,no stemi, no pr depression ____________________________________________  RADIOLOGY  I personally reviewed all radiographic images ordered to evaluate for the above acute complaints and reviewed radiology reports and findings.  These findings were personally discussed with the patient.  Please see medical record for radiology report.  ____________________________________________   PROCEDURES  Procedure(s) performed:  Procedures    Critical Care performed: no ____________________________________________   INITIAL IMPRESSION / ASSESSMENT AND PLAN / ED COURSE  Pertinent labs & imaging results that were available during my care of the patient were reviewed by me and considered in my medical decision making (see chart for details).   DDX: ACS, pericarditis,  esophagitis, boerhaaves, pe, dissection, pna, bronchitis, costochondritis   Molly Fernandez is a 43 y.o. who presents to the ED with symptoms as described above.  Patient afebrile mildly hypertensive but in no acute distress.  She is describing nearly 1 week of constant chest pressure and pain.  Her EKG shows no evidence of acute ischemia.  Troponin is negative.  Certainly possible musculoskeletal and there is some reproducibility.  Her abdominal exam is soft and benign.  There is no evidence of infectious process.  She is low risk by Wells criteria and is PERC negative.  Not consistent with ACS or dissection.  Low risk by heart score of 2.  Given duration of symptoms I do not feel that she needs serial enzymes particularly in setting of known coronavirus positive patients in our ER at this time. States that she does have a history of anxiety which may be contributing to her symptoms.  Declined covid testing.  Chest x-ray does show rhonchitic changes which may be contributing the patient's symptoms will treat with albuterol as well as steroids.  Given her reassuring work-up without evidence of acute emergent pathology I do  believe she stable and appropriate for further work-up and management as an outpatient.  We discussed signs and symptoms for which she should return to the ER.      As part of my medical decision making, I reviewed the following data within the electronic MEDICAL RECORD NUMBER Nursing notes reviewed and incorporated, Labs reviewed, notes from prior ED visits and  Controlled Substance Database   ____________________________________________   FINAL CLINICAL IMPRESSION(S) / ED DIAGNOSES  Final diagnoses:  Atypical chest pain      NEW MEDICATIONS STARTED DURING THIS VISIT:  New Prescriptions   No medications on file     Note:  This document was prepared using Dragon voice recognition software and may include unintentional dictation errors.    Willy Eddyobinson, Dominque Levandowski, MD 02/11/19 1213

## 2019-02-11 NOTE — ED Notes (Signed)
NAD noted at time of D/C. Pt denies questions or concerns. Pt ambulatory to the lobby at this time, states her family member is in the parking lot to pick her up.

## 2019-02-11 NOTE — ED Triage Notes (Signed)
Says mid sternal chest pain feels like squeezing, constant for 1 week.

## 2019-08-04 ENCOUNTER — Other Ambulatory Visit: Payer: Self-pay

## 2019-08-04 DIAGNOSIS — Z20822 Contact with and (suspected) exposure to covid-19: Secondary | ICD-10-CM

## 2019-08-06 LAB — NOVEL CORONAVIRUS, NAA: SARS-CoV-2, NAA: NOT DETECTED

## 2019-11-21 ENCOUNTER — Ambulatory Visit: Payer: Self-pay | Attending: Internal Medicine

## 2019-12-31 ENCOUNTER — Ambulatory Visit: Payer: Self-pay | Attending: Internal Medicine

## 2020-08-09 ENCOUNTER — Emergency Department
Admission: EM | Admit: 2020-08-09 | Discharge: 2020-08-09 | Disposition: A | Discharge status: Home / Self Care | Payer: Self-pay | Attending: Emergency Medicine | Admitting: Emergency Medicine

## 2020-08-09 ENCOUNTER — Other Ambulatory Visit: Payer: Self-pay

## 2020-08-09 ENCOUNTER — Encounter: Payer: Self-pay | Admitting: Intensive Care

## 2020-08-09 DIAGNOSIS — G43909 Migraine, unspecified, not intractable, without status migrainosus: Secondary | ICD-10-CM | POA: Insufficient documentation

## 2020-08-09 DIAGNOSIS — Z5321 Procedure and treatment not carried out due to patient leaving prior to being seen by health care provider: Secondary | ICD-10-CM | POA: Insufficient documentation

## 2020-08-09 NOTE — ED Notes (Signed)
Phone called- went to voicemail immediately.

## 2020-08-09 NOTE — ED Notes (Signed)
Called pt in waiting area and then called her on cell.  No answer.

## 2020-08-09 NOTE — ED Triage Notes (Signed)
Patient c/o migraine X3 days. Noise sensitivity. HX migraines and reports feels the same. Ibuprofen taken with no relief.

## 2020-08-18 ENCOUNTER — Emergency Department: Payer: HRSA Program

## 2020-08-18 ENCOUNTER — Other Ambulatory Visit: Payer: Self-pay

## 2020-08-18 ENCOUNTER — Emergency Department
Admission: EM | Admit: 2020-08-18 | Discharge: 2020-08-18 | Disposition: A | Discharge status: Home / Self Care | Payer: HRSA Program | Attending: Emergency Medicine | Admitting: Emergency Medicine

## 2020-08-18 DIAGNOSIS — Z79899 Other long term (current) drug therapy: Secondary | ICD-10-CM | POA: Insufficient documentation

## 2020-08-18 DIAGNOSIS — N3000 Acute cystitis without hematuria: Secondary | ICD-10-CM

## 2020-08-18 DIAGNOSIS — I10 Essential (primary) hypertension: Secondary | ICD-10-CM | POA: Diagnosis not present

## 2020-08-18 DIAGNOSIS — R509 Fever, unspecified: Secondary | ICD-10-CM | POA: Diagnosis present

## 2020-08-18 DIAGNOSIS — U071 COVID-19: Secondary | ICD-10-CM | POA: Insufficient documentation

## 2020-08-18 DIAGNOSIS — F17210 Nicotine dependence, cigarettes, uncomplicated: Secondary | ICD-10-CM | POA: Diagnosis not present

## 2020-08-18 LAB — RESP PANEL BY RT-PCR (FLU A&B, COVID) ARPGX2
Influenza A by PCR: NEGATIVE
Influenza B by PCR: NEGATIVE
SARS Coronavirus 2 by RT PCR: POSITIVE — AB

## 2020-08-18 LAB — CBC WITH DIFFERENTIAL/PLATELET
Abs Immature Granulocytes: 0.03 10*3/uL (ref 0.00–0.07)
Basophils Absolute: 0 10*3/uL (ref 0.0–0.1)
Basophils Relative: 0 %
Eosinophils Absolute: 0 10*3/uL (ref 0.0–0.5)
Eosinophils Relative: 0 %
HCT: 38.2 % (ref 36.0–46.0)
Hemoglobin: 12.3 g/dL (ref 12.0–15.0)
Immature Granulocytes: 0 %
Lymphocytes Relative: 12 %
Lymphs Abs: 0.8 10*3/uL (ref 0.7–4.0)
MCH: 27.9 pg (ref 26.0–34.0)
MCHC: 32.2 g/dL (ref 30.0–36.0)
MCV: 86.6 fL (ref 80.0–100.0)
Monocytes Absolute: 1 10*3/uL (ref 0.1–1.0)
Monocytes Relative: 13 %
Neutro Abs: 5.4 10*3/uL (ref 1.7–7.7)
Neutrophils Relative %: 75 %
Platelets: 238 10*3/uL (ref 150–400)
RBC: 4.41 MIL/uL (ref 3.87–5.11)
RDW: 14.6 % (ref 11.5–15.5)
WBC: 7.2 10*3/uL (ref 4.0–10.5)
nRBC: 0 % (ref 0.0–0.2)

## 2020-08-18 LAB — COMPREHENSIVE METABOLIC PANEL
ALT: 15 U/L (ref 0–44)
AST: 16 U/L (ref 15–41)
Albumin: 3.7 g/dL (ref 3.5–5.0)
Alkaline Phosphatase: 102 U/L (ref 38–126)
Anion gap: 7 (ref 5–15)
BUN: 12 mg/dL (ref 6–20)
CO2: 25 mmol/L (ref 22–32)
Calcium: 8.6 mg/dL — ABNORMAL LOW (ref 8.9–10.3)
Chloride: 102 mmol/L (ref 98–111)
Creatinine, Ser: 0.83 mg/dL (ref 0.44–1.00)
GFR, Estimated: >60 mL/min (ref >60)
Glucose, Bld: 87 mg/dL (ref 70–99)
Potassium: 3.3 mmol/L — ABNORMAL LOW (ref 3.5–5.1)
Sodium: 134 mmol/L — ABNORMAL LOW (ref 135–145)
Total Bilirubin: 0.5 mg/dL (ref 0.3–1.2)
Total Protein: 7.2 g/dL (ref 6.5–8.1)

## 2020-08-18 LAB — URINALYSIS, COMPLETE (UACMP) WITH MICROSCOPIC
Bilirubin Urine: NEGATIVE
Glucose, UA: NEGATIVE mg/dL
Hgb urine dipstick: NEGATIVE
Ketones, ur: 5 mg/dL — AB
Leukocytes,Ua: NEGATIVE
Nitrite: POSITIVE — AB
Protein, ur: 30 mg/dL — AB
Specific Gravity, Urine: 1.02 (ref 1.005–1.030)
pH: 5 (ref 5.0–8.0)

## 2020-08-18 LAB — POC URINE PREG, ED: Preg Test, Ur: NEGATIVE

## 2020-08-18 MED ORDER — POTASSIUM CHLORIDE CRYS ER 20 MEQ PO TBCR
20.0000 meq | EXTENDED_RELEASE_TABLET | Freq: Once | ORAL | Status: AC
  Administered 2020-08-18: 20 meq via ORAL
  Filled 2020-08-18: qty 1

## 2020-08-18 MED ORDER — SODIUM CHLORIDE 0.9 % IV BOLUS
1000.0000 mL | Freq: Once | INTRAVENOUS | Status: AC
  Administered 2020-08-18: 1000 mL via INTRAVENOUS

## 2020-08-18 MED ORDER — ONDANSETRON 4 MG PO TBDP: 4.0000 mg | ORAL_TABLET | Freq: Three times a day (TID) | ORAL | 0 refills | Status: DC | PRN

## 2020-08-18 MED ORDER — SULFAMETHOXAZOLE-TRIMETHOPRIM 800-160 MG PO TABS
1.0000 | ORAL_TABLET | Freq: Once | ORAL | Status: AC
  Administered 2020-08-18: 1 via ORAL
  Filled 2020-08-18: qty 1

## 2020-08-18 MED ORDER — SULFAMETHOXAZOLE-TRIMETHOPRIM 800-160 MG PO TABS: 1.0000 | ORAL_TABLET | Freq: Two times a day (BID) | ORAL | 0 refills | Status: AC

## 2020-08-18 NOTE — ED Triage Notes (Signed)
Pt reports she did not take antihypertensive medication as scheduled this am.

## 2020-08-18 NOTE — ED Triage Notes (Signed)
Pt presents via POV c/o fever, cough, chill and SOB x2-3 days. Reports Covid exposure.

## 2020-08-18 NOTE — ED Notes (Signed)
Pt c/o fevers, chills, body aches, sore throat, N/V/D throughout the night, and dry cough. Pt is AOX4, ambulatory, NAD noted.

## 2020-08-18 NOTE — ED Notes (Signed)
Called for room x 1.  

## 2020-08-18 NOTE — ED Provider Notes (Signed)
[facility] Emergency Department Provider Note  ____________________________________________   Event Date/Time   First MD Initiated Contact with Patient 08/18/20 1824     (approximate)  I have reviewed the triage vital signs and the nursing notes.   HISTORY  Chief Complaint URI  HPI Molly Fernandez is a 44 y.o. female who presents to the emergency department for evaluation of flulike illness.  Patient states that she was exposed to a Covid positive individual 5 to 6 days ago.  She reports that for the last 2 to 3 days, she has experienced fever, dizziness, cough, body aches and chills, shortness of breath, nausea vomiting and diarrhea.  She states that she was up "all night last night" with diarrhea more times than she can count and states that she has had episodes of vomiting intermittently in between.  She denies any abdominal pain, chest pain.  She states that she has received one of the 2 doses of Covid vaccine.  She has not had any substantial food or drink that she has been able to keep down in 48 hours.         Past Medical History:  Diagnosis Date  . Hypertension   . Migraine   . Migraine     There are no problems to display for this patient.   Past Surgical History:  Procedure Laterality Date  . BREAST LUMPECTOMY Left 1996  . LEG SURGERY Right     Prior to Admission medications   Medication Sig Start Date End Date Taking? Authorizing Provider  albuterol (VENTOLIN HFA) 108 (90 Base) MCG/ACT inhaler Inhale 2 puffs into the lungs every 6 (six) hours as needed for wheezing or shortness of breath. 02/11/19   Willy Eddy, MD  hydrochlorothiazide (HYDRODIURIL) 25 MG tablet Take 25 mg by mouth daily.    [provider]  ibuprofen (ADVIL,MOTRIN) 800 MG tablet Take 1 tablet (800 mg total) by mouth every 8 (eight) hours as needed. 05/09/16   Beers, Charmayne Sheer, PA-C  lisinopril (PRINIVIL,ZESTRIL) 20 MG tablet Take 20 mg by mouth daily.     [provider]  metoCLOPramide (REGLAN) 10 MG tablet Take 1 tablet (10 mg total) by mouth every 8 (eight) hours as needed for nausea or vomiting. Patient not taking: Reported on 11/14/2015 02/19/15   Emily Filbert, MD  ondansetron (ZOFRAN ODT) 8 MG disintegrating tablet Take 1 tablet (8 mg total) by mouth every 8 (eight) hours as needed for nausea or vomiting. Patient not taking: Reported on 11/14/2015 03/24/15   Sharman Cheek, MD  prochlorperazine (COMPAZINE) 10 MG tablet Take 1 tablet (10 mg total) by mouth every 6 (six) hours as needed for nausea. 11/15/15   Irean Hong, MD  sulfamethoxazole-trimethoprim (BACTRIM DS) 800-160 MG tablet Take 1 tablet by mouth 2 (two) times daily for 10 days. 08/18/20 08/28/20  Lucy Chris, PA  traMADol (ULTRAM) 50 MG tablet Take 1 tablet (50 mg total) by mouth every 6 (six) hours as needed. 01/21/17   Triplett, Rulon Eisenmenger B, FNP    Allergies Hydrocodone-acetaminophen and Penicillins  History reviewed. No pertinent family history.  Social History Social History   Tobacco Use  . Smoking status: Current Every Day Smoker    Packs/day: 0.50    Types: Cigarettes  . Smokeless tobacco: Never Used  Substance Use Topics  . Alcohol use: Yes    Comment: Socially  . Drug use: Yes    Types: Marijuana    Review of Systems Constitutional: +  fever/chills Eyes: No visual changes. ENT: + Nasal congestion, no sore throat. Cardiovascular: Denies chest pain. Respiratory: + shortness of breath. Gastrointestinal: No abdominal pain.  + nausea, + vomiting.  + diarrhea.  No constipation. Genitourinary: Negative for dysuria. Musculoskeletal: Negative for back pain. Skin: Negative for rash. Neurological: + Dizziness, negative for headaches, focal weakness or numbness.  ____________________________________________   PHYSICAL EXAM:  VITAL SIGNS: ED Triage Vitals  Enc Vitals Group     BP 08/18/20 1730 (!) 176/116     Pulse Rate 08/18/20 1730 87      Resp 08/18/20 1730 16     Temp 08/18/20 1730 98.6 F (37 C)     Temp Source 08/18/20 1730 Oral     SpO2 08/18/20 1730 99 %     Weight --      Height --      Head Circumference --      Peak Flow --      Pain Score 08/18/20 1729 10     Pain Loc --      Pain Edu? --      Excl. in GC? --    Constitutional: Alert and oriented.  Ill appearing but in no acute distress. Eyes: Conjunctivae are normal. PERRL. EOMI. Head: Atraumatic. Nose: Mild congestion/rhinnorhea. Mouth/Throat: Mucous membranes are moist.  Oropharynx erythematous without any tonsillar enlargement or exudate. Neck: No stridor.   Lymphatic: No cervical lymphadenopathy Cardiovascular: Normal rate, regular rhythm. Grossly normal heart sounds.  Good peripheral circulation. Respiratory: Normal respiratory effort.  No retractions. Lungs CTAB. Gastrointestinal: Soft and nontender. No distention. No abdominal bruits. No CVA tenderness. Musculoskeletal: No lower extremity tenderness nor edema.  No joint effusions. Neurologic:  Normal speech and language. No gross focal neurologic deficits are appreciated. No gait instability. Skin:  Skin is warm, dry and intact. No rash noted. Psychiatric: Mood and affect are normal. Speech and behavior are normal.  ____________________________________________   LABS (all labs ordered are listed, but only abnormal results are displayed)  Labs Reviewed  RESP PANEL BY RT-PCR (FLU A&B, COVID) ARPGX2 - Abnormal; Notable for the following components:      Result Value   SARS Coronavirus 2 by RT PCR POSITIVE (*)    All other components within normal limits  COMPREHENSIVE METABOLIC PANEL - Abnormal; Notable for the following components:   Sodium 134 (*)    Potassium 3.3 (*)    Calcium 8.6 (*)    All other components within normal limits  URINALYSIS, COMPLETE (UACMP) WITH MICROSCOPIC - Abnormal; Notable for the following components:   Color, Urine YELLOW (*)    APPearance CLOUDY (*)     Ketones, ur 5 (*)    Protein, ur 30 (*)    Nitrite POSITIVE (*)    Bacteria, UA FEW (*)    All other components within normal limits  URINE CULTURE  CBC WITH DIFFERENTIAL/PLATELET  POC URINE PREG, ED   ____________________________________________  EKG  Normal sinus rhythm with a rate of 73 bpm, QTC 429.  There are no ST segment elevation or depressions, no T wave inversions or abnormalities.  No evidence of acute ischemia. ____________________________________________  RADIOLOGY I, Lucy Chrisaitlin J Drezden Seitzinger, personally viewed and evaluated these images (plain radiographs) as part of my medical decision making, as well as reviewing the written report by the radiologist.  ED provider interpretation: No focal pneumonia  Official radiology report(s): DG Chest Portable 1 View  Result Date: 08/18/2020 CLINICAL DATA:  Shortness of breath, fever and cough. EXAM: PORTABLE CHEST  1 VIEW COMPARISON:  February 11, 2019 FINDINGS: The heart size and mediastinal contours are within normal limits. Both lungs are clear. The visualized skeletal structures are unremarkable. IMPRESSION: No active disease. Electronically Signed   By: Aram Candela M.D.   On: 08/18/2020 20:01   ____________________________________________   INITIAL IMPRESSION / ASSESSMENT AND PLAN / ED COURSE  As part of my medical decision making, I reviewed the following data within the electronic MEDICAL RECORD NUMBER Nursing notes reviewed and incorporated, Labs reviewed, EKG interpreted and Radiograph reviewed        Patient is a 44 year old female presents to the emergency department for evaluation of flulike illness after she was exposed to Covid positive individual 5 to 6 days ago.  She has had symptoms for 2 to 3 days.  She reports nasal congestion, cough, shortness of breath, nausea, vomiting and diarrhea as well as fever, chills and body aches.  Overall, she does look like she does not feel well, but is not in any acute distress.  On  physical exam, the patient is hypertensive but states that she has not taken her blood pressure medications today and this is her norm when she does not take the medications, otherwise vital signs are normal.  She does have an erythematous throat as well as nasal congestion but no abnormal heart or lung auscultation, no abdominal tenderness.  Evaluation began with a urinalysis, CMP, CBC, respiratory swab as well as chest x-ray.  The patient's urinalysis does show ketones, proteinuria, nitrite positive and few bacteria.  This was sent for further culture.  The patient is positive for Covid on respiratory panel, negative for flu.  CBC is grossly unremarkable, CMP shows a mild decrease in the patient sodium and potassium.  Chest x-ray reveals no focal pneumonia.  EKG is grossly unremarkable.    I do believe given the patient's physical exam findings as well as laboratory evaluation, she likely has a acute cystitis in addition to her Covid positive status.  Though the patient has experienced intermittent dizziness, PE is unlikely given that she does not meet any PERC criteria.  Her EKG and chest x-ray are also reassuring.  Treatment began with initiating Bactrim for patient's presumed UTI given her UA findings.  She also received 1 L of normal saline fluid bolus as well as 20 M EQ potassium repletion orally.  We will discharge the patient home with Zofran as well as Bactrim for acute UTI.  Patient was instructed to have follow-up with her primary care, or return to the emergency department for any worsening.  Patient was also given a work note given that she is a Production designer, theatre/television/film at [employer].  The patient is amenable with this plan, she stable this time for outpatient therapy.      ____________________________________________   FINAL CLINICAL IMPRESSION(S) / ED DIAGNOSES  Final diagnoses:  COVID  Acute cystitis without hematuria     ED Discharge Orders         Ordered    sulfamethoxazole-trimethoprim  (BACTRIM DS) 800-160 MG tablet  2 times daily        08/18/20 2129          *Please note:  Molly Fernandez was evaluated in Emergency Department on 08/18/2020 for the symptoms described in the history of present illness. She was evaluated in the context of the global COVID-19 pandemic, which necessitated consideration that the patient might be at risk for infection with the SARS-CoV-2 virus that causes COVID-19. Institutional protocols and algorithms  that pertain to the evaluation of patients at risk for COVID-19 are in a state of rapid change based on information released by regulatory bodies including the CDC and federal and state organizations. These policies and algorithms were followed during the patient's care in the ED.  Some ED evaluations and interventions may be delayed as a result of limited staffing during and the pandemic.*   Note:  This document was prepared using Dragon voice recognition software and may include unintentional dictation errors.    Lucy Chris, PA 08/18/20 2352    Merwyn Katos, MD 08/19/20 [phone]

## 2020-08-19 ENCOUNTER — Other Ambulatory Visit: Payer: Self-pay | Admitting: Nurse Practitioner

## 2020-08-19 DIAGNOSIS — Z609 Problem related to social environment, unspecified: Secondary | ICD-10-CM

## 2020-08-19 DIAGNOSIS — U071 COVID-19: Secondary | ICD-10-CM

## 2020-08-19 NOTE — Progress Notes (Signed)
I connected by phone with Molly Fernandez on 08/19/2020 at 4:57 PM to discuss the potential use of a new treatment for mild to moderate COVID-19 viral infection in non-hospitalized patients.  This patient is a 44 y.o. female that meets the FDA criteria for Emergency Use Authorization of COVID monoclonal antibody casirivimab/imdevimab, bamlanivimab/etesevimab, or sotrovimab.  Has a (+) direct SARS-CoV-2 viral test result  Has mild or moderate COVID-19   Is NOT hospitalized due to COVID-19  Is within 10 days of symptom onset  Has at least one of the high risk factor(s) for progression to severe COVID-19 and/or hospitalization as defined in EUA.  Specific high risk criteria : Other high risk medical condition per CDC:  high risk group   I have spoken and communicated the following to the patient or parent/caregiver regarding COVID monoclonal antibody treatment:  1. FDA has authorized the emergency use for the treatment of mild to moderate COVID-19 in adults and pediatric patients with positive results of direct SARS-CoV-2 viral testing who are 56 years of age and older weighing at least 40 kg, and who are at high risk for progressing to severe COVID-19 and/or hospitalization.  2. The significant known and potential risks and benefits of COVID monoclonal antibody, and the extent to which such potential risks and benefits are unknown.  3. Information on available alternative treatments and the risks and benefits of those alternatives, including clinical trials.  4. Patients treated with COVID monoclonal antibody should continue to self-isolate and use infection control measures (e.g., wear mask, isolate, social distance, avoid sharing personal items, clean and disinfect "high touch" surfaces, and frequent handwashing) according to CDC guidelines.   5. The patient or parent/caregiver has the option to accept or refuse COVID monoclonal antibody treatment.  After reviewing this information with  the patient, the patient has agreed to receive one of the available covid 19 monoclonal antibodies and will be provided an appropriate fact sheet prior to infusion. Ivonne Andrew, NP 08/19/2020 4:57 PM

## 2020-08-20 ENCOUNTER — Ambulatory Visit (HOSPITAL_COMMUNITY)
Admission: RE | Admit: 2020-08-20 | Discharge: 2020-08-20 | Disposition: A | Discharge status: Home / Self Care | Payer: HRSA Program | Source: Ambulatory Visit | Attending: Pulmonary Disease | Admitting: Pulmonary Disease

## 2020-08-20 DIAGNOSIS — U071 COVID-19: Secondary | ICD-10-CM | POA: Insufficient documentation

## 2020-08-20 DIAGNOSIS — Z609 Problem related to social environment, unspecified: Secondary | ICD-10-CM | POA: Diagnosis present

## 2020-08-20 MED ORDER — SODIUM CHLORIDE 0.9 % IV SOLN: INTRAVENOUS | Status: DC | PRN

## 2020-08-20 MED ORDER — EPINEPHRINE 0.3 MG/0.3ML IJ SOAJ: 0.3000 mg | Freq: Once | INTRAMUSCULAR | Status: DC | PRN

## 2020-08-20 MED ORDER — FAMOTIDINE IN NACL 20-0.9 MG/50ML-% IV SOLN: 20.0000 mg | Freq: Once | INTRAVENOUS | Status: DC | PRN

## 2020-08-20 MED ORDER — METHYLPREDNISOLONE SODIUM SUCC 125 MG IJ SOLR: 125.0000 mg | Freq: Once | INTRAMUSCULAR | Status: DC | PRN

## 2020-08-20 MED ORDER — SODIUM CHLORIDE 0.9 % IV SOLN: Freq: Once | INTRAVENOUS | Status: AC

## 2020-08-20 MED ORDER — DIPHENHYDRAMINE HCL 50 MG/ML IJ SOLN: 50.0000 mg | Freq: Once | INTRAMUSCULAR | Status: DC | PRN

## 2020-08-20 MED ORDER — ALBUTEROL SULFATE HFA 108 (90 BASE) MCG/ACT IN AERS: 2.0000 | INHALATION_SPRAY | Freq: Once | RESPIRATORY_TRACT | Status: DC | PRN

## 2020-08-20 NOTE — Progress Notes (Signed)
  Diagnosis: COVID-19  Physician:  Patrick Wright  Procedure: Covid Infusion Clinic Med: casirivimab\imdevimab infusion - Provided patient with casirivimab\imdevimab fact sheet for patients, parents and caregivers prior to infusion.  Complications: No immediate complications noted.  Discharge: Discharged home   Treana Lacour L 08/20/2020   

## 2020-08-20 NOTE — Progress Notes (Signed)
Patient reviewed Fact Sheet for Patients, Parents, and Caregivers for Emergency Use Authorization (EUA) of Casirivimab and Imdevimab for the Treatment of Coronavirus. Patient also reviewed and is agreeable to the estimated cost of treatment. Patient is agreeable to proceed.   

## 2020-08-20 NOTE — Discharge Instructions (Signed)
10 Things You Can Do to Manage Your COVID-19 Symptoms at Home If you have possible or confirmed COVID-19: 1. Stay home from work and school. And stay away from other public places. If you must go out, avoid using any kind of public transportation, ridesharing, or taxis. 2. Monitor your symptoms carefully. If your symptoms get worse, call your healthcare provider immediately. 3. Get rest and stay hydrated. 4. If you have a medical appointment, call the healthcare provider ahead of time and tell them that you have or may have COVID-19. 5. For medical emergencies, call 911 and notify the dispatch personnel that you have or may have COVID-19. 6. Cover your cough and sneezes with a tissue or use the inside of your elbow. 7. Wash your hands often with soap and water for at least 20 seconds or clean your hands with an alcohol-based hand sanitizer that contains at least 60% alcohol. 8. As much as possible, stay in a specific room and away from other people in your home. Also, you should use a separate bathroom, if available. If you need to be around other people in or outside of the home, wear a mask. 9. Avoid sharing personal items with other people in your household, like dishes, towels, and bedding. 10. Clean all surfaces that are touched often, like counters, tabletops, and doorknobs. Use household cleaning sprays or wipes according to the label instructions. cdc.gov/coronavirus 02/23/2019 This information is not intended to replace advice given to you by your health care provider. Make sure you discuss any questions you have with your health care provider. Document Revised: 07/28/2019 Document Reviewed: 07/28/2019 Elsevier Patient Education  2020 [employer]. If you have any questions or concerns after the infusion please call the Advanced Practice Provider on call at [phone]. This number is ONLY intended for your use regarding questions or concerns about the infusion post-treatment  side-effects.  Please do not provide this number to others for use. For return to work notes please contact your primary care provider.   If someone you know is interested in receiving treatment please have them call the COVID hotline at [phone].  What types of side effects do monoclonal antibody drugs cause?  Common side effects  In general, the more common side effects caused by monoclonal antibody drugs include: . Allergic reactions, such as hives or itching . Flu-like signs and symptoms, including chills, fatigue, fever, and muscle aches and pains . Nausea, vomiting . Diarrhea . Skin rashes . Low blood pressure   The CDC is recommending patients who receive monoclonal antibody treatments wait at least 90 days before being vaccinated.  Currently, there are no data on the safety and efficacy of mRNA COVID-19 vaccines in persons who received monoclonal antibodies or convalescent plasma as part of COVID-19 treatment. Based on the estimated half-life of such therapies as well as evidence suggesting that reinfection is uncommon in the 90 days after initial infection, vaccination should be deferred for at least 90 days, as a precautionary measure until additional information becomes available, to avoid interference of the antibody treatment with vaccine-induced immune responses. 

## 2020-08-21 LAB — URINE CULTURE: Culture: >=100000 — AB

## 2020-12-31 ENCOUNTER — Emergency Department
Admission: EM | Admit: 2020-12-31 | Discharge: 2020-12-31 | Disposition: A | Discharge status: Home / Self Care | Payer: Self-pay | Attending: Emergency Medicine | Admitting: Emergency Medicine

## 2020-12-31 ENCOUNTER — Emergency Department: Payer: Self-pay

## 2020-12-31 ENCOUNTER — Other Ambulatory Visit: Payer: Self-pay

## 2020-12-31 DIAGNOSIS — Z3A01 Less than 8 weeks gestation of pregnancy: Secondary | ICD-10-CM | POA: Insufficient documentation

## 2020-12-31 DIAGNOSIS — O200 Threatened abortion: Secondary | ICD-10-CM | POA: Insufficient documentation

## 2020-12-31 DIAGNOSIS — F17210 Nicotine dependence, cigarettes, uncomplicated: Secondary | ICD-10-CM | POA: Insufficient documentation

## 2020-12-31 DIAGNOSIS — I10 Essential (primary) hypertension: Secondary | ICD-10-CM | POA: Insufficient documentation

## 2020-12-31 DIAGNOSIS — Z79899 Other long term (current) drug therapy: Secondary | ICD-10-CM | POA: Insufficient documentation

## 2020-12-31 LAB — CBC
HCT: 41.3 % (ref 36.0–46.0)
Hemoglobin: 13.7 g/dL (ref 12.0–15.0)
MCH: 28.4 pg (ref 26.0–34.0)
MCHC: 33.2 g/dL (ref 30.0–36.0)
MCV: 85.5 fL (ref 80.0–100.0)
Platelets: 276 10*3/uL (ref 150–400)
RBC: 4.83 MIL/uL (ref 3.87–5.11)
RDW: 14.2 % (ref 11.5–15.5)
WBC: 9.9 10*3/uL (ref 4.0–10.5)
nRBC: 0 % (ref 0.0–0.2)

## 2020-12-31 LAB — URINALYSIS, COMPLETE (UACMP) WITH MICROSCOPIC
Bilirubin Urine: NEGATIVE
Glucose, UA: NEGATIVE mg/dL
Ketones, ur: NEGATIVE mg/dL
Leukocytes,Ua: NEGATIVE
Nitrite: NEGATIVE
Protein, ur: 30 mg/dL — AB
Specific Gravity, Urine: 1.023 (ref 1.005–1.030)
pH: 6 (ref 5.0–8.0)

## 2020-12-31 LAB — COMPREHENSIVE METABOLIC PANEL
ALT: 13 U/L (ref 0–44)
AST: 18 U/L (ref 15–41)
Albumin: 4.1 g/dL (ref 3.5–5.0)
Alkaline Phosphatase: 105 U/L (ref 38–126)
Anion gap: 7 (ref 5–15)
BUN: 15 mg/dL (ref 6–20)
CO2: 22 mmol/L (ref 22–32)
Calcium: 9.1 mg/dL (ref 8.9–10.3)
Chloride: 107 mmol/L (ref 98–111)
Creatinine, Ser: 0.97 mg/dL (ref 0.44–1.00)
GFR, Estimated: >60 mL/min (ref >60)
Glucose, Bld: 101 mg/dL — ABNORMAL HIGH (ref 70–99)
Potassium: 3.4 mmol/L — ABNORMAL LOW (ref 3.5–5.1)
Sodium: 136 mmol/L (ref 135–145)
Total Bilirubin: 0.4 mg/dL (ref 0.3–1.2)
Total Protein: 7.9 g/dL (ref 6.5–8.1)

## 2020-12-31 LAB — POC URINE PREG, ED: Preg Test, Ur: POSITIVE — AB

## 2020-12-31 LAB — HCG, QUANTITATIVE, PREGNANCY: hCG, Beta Chain, Quant, S: 28 m[IU]/mL — ABNORMAL HIGH (ref <5)

## 2020-12-31 LAB — SAMPLE TO BLOOD BANK

## 2020-12-31 LAB — LIPASE, BLOOD: Lipase: 36 U/L (ref 11–51)

## 2020-12-31 NOTE — ED Notes (Signed)
Pt states that she has had elevated bp for the past 12 years despite taking bp meds

## 2020-12-31 NOTE — ED Triage Notes (Signed)
Pt states her lmp was 4-4, was late so she took a test this weekend that was positive, pt states that today she started having some spotting and lower abd cramping and states that she passed a large clot

## 2020-12-31 NOTE — Discharge Instructions (Addendum)
As we discussed your ultrasound did not show a pregnancy today.  Your pregnancy hormone level is 28.  This could either represent a pregnancy too early to see or more likely unfortunately a miscarriage.  Please follow-up with your doctor in 48 hours for recheck of your pregnancy hormone level.  Return to the emergency department for any worsening abdominal pain, or any other symptom personally concerning to yourself.

## 2020-12-31 NOTE — ED Provider Notes (Signed)
[facility] Emergency Department Provider Note  Time seen: 4:21 PM  I have reviewed the triage vital signs and the nursing notes.   HISTORY  Chief Complaint Abdominal Pain and Vaginal Bleeding   HPI Molly Fernandez is a 45 y.o. female G6, P4 A1 who presents to the emergency department for vaginal bleeding.  According to the patient she was told with her last pregnancy that she could no longer get pregnant.  States she was late by approximately 1 week on her period so she took a home pregnancy test that was positive.  States she began having some spotting over the weekend and then this morning developed more significant vaginal bleeding almost to the level of a menstrual cycle with some lower abdominal cramping consistent with menstrual cramping as well.  Denies any vomiting or diarrhea.  No fever.   Past Medical History:  Diagnosis Date  . Hypertension   . Migraine   . Migraine     There are no problems to display for this patient.   Past Surgical History:  Procedure Laterality Date  . BREAST LUMPECTOMY Left 1996  . LEG SURGERY Right     Prior to Admission medications   Medication Sig Start Date End Date Taking? Authorizing Provider  albuterol (VENTOLIN HFA) 108 (90 Base) MCG/ACT inhaler Inhale 2 puffs into the lungs every 6 (six) hours as needed for wheezing or shortness of breath. 02/11/19   Willy Eddy, MD  hydrochlorothiazide (HYDRODIURIL) 25 MG tablet Take 25 mg by mouth daily.    [provider]  ibuprofen (ADVIL,MOTRIN) 800 MG tablet Take 1 tablet (800 mg total) by mouth every 8 (eight) hours as needed. 05/09/16   Beers, Charmayne Sheer, PA-C  lisinopril (PRINIVIL,ZESTRIL) 20 MG tablet Take 20 mg by mouth daily.    [provider]  metoCLOPramide (REGLAN) 10 MG tablet Take 1 tablet (10 mg total) by mouth every 8 (eight) hours as needed for nausea or vomiting. Patient not taking: Reported on 11/14/2015 02/19/15   Emily Filbert,  MD  ondansetron (ZOFRAN ODT) 4 MG disintegrating tablet Take 1 tablet (4 mg total) by mouth every 8 (eight) hours as needed for nausea or vomiting. 08/18/20   Lucy Chris, PA  prochlorperazine (COMPAZINE) 10 MG tablet Take 1 tablet (10 mg total) by mouth every 6 (six) hours as needed for nausea. 11/15/15   Irean Hong, MD  traMADol (ULTRAM) 50 MG tablet Take 1 tablet (50 mg total) by mouth every 6 (six) hours as needed. 01/21/17   Chinita Pester, FNP    Allergies  Allergen Reactions  . Hydrocodone-Acetaminophen Itching and Swelling  . Penicillins Hives    No family history on file.  Social History Social History   Tobacco Use  . Smoking status: Current Every Day Smoker    Packs/day: 0.50    Types: Cigarettes  . Smokeless tobacco: Never Used  Substance Use Topics  . Alcohol use: Yes    Comment: Socially  . Drug use: Yes    Types: Marijuana    Review of Systems Constitutional: Negative for feve Cardiovascular: Negative for chest pain. Respiratory: Negative for shortness of breath. Gastrointestinal: Abdominal cramping, moderate. Genitourinary: Vaginal bleeding Musculoskeletal: Negative for musculoskeletal complaints Neurological: Negative for headache All other ROS negative  ____________________________________________   PHYSICAL EXAM:  VITAL SIGNS: ED Triage Vitals [12/31/20 1504]  Enc Vitals Group     BP (!) 194/131     Pulse Rate (!) 103  Resp      Temp 98.6 F (37 C)     Temp Source Oral     SpO2 100 %     Weight 220 lb (99.8 kg)     Height 5\' 3"  (1.6 m)     Head Circumference      Peak Flow      Pain Score 8     Pain Loc      Pain Edu?      Excl. in GC?     Constitutional: Alert and oriented. Well appearing and in no distress. Eyes: Normal exam ENT      Head: Normocephalic and atraumatic.      Mouth/Throat: Mucous membranes are moist. Cardiovascular: Normal rate, regular rhythm.  Respiratory: Normal respiratory effort without  tachypnea nor retractions. Breath sounds are clear Gastrointestinal: Soft, mild to moderate lower abdominal tenderness palpation/suprapubic described as cramping. Musculoskeletal: Nontender with normal range of motion in all extremities.  Neurologic:  Normal speech and language. No gross focal neurologic deficits  Skin:  Skin is warm, dry and intact.  Psychiatric: Mood and affect are normal.   ____________________________________________    RADIOLOGY  No IUP visualized  ____________________________________________   INITIAL IMPRESSION / ASSESSMENT AND PLAN / ED COURSE  Pertinent labs & imaging results that were available during my care of the patient were reviewed by me and considered in my medical decision making (see chart for details).   Patient presents emergency department for lower abdominal cramping and vaginal bleeding after taking a positive pregnancy test.  Given the bleeding differential would include threatened miscarriage, subchorionic hemorrhage, miscarriage in progress, ectopic.  Patient's lab work today shows no concerning findings, positive urine pregnancy test we will add on a quantitative beta-hCG as well as an ultrasound.  Patient agreeable.  Record review in Care Everywhere shows O+ blood type.  Beta-hCG resulted at 28.  No IUP on ultrasound.  Highly suspect miscarriage.  Discussed with patient return precautions as well as OB/PCP follow-up in 48 hours for recheck of her beta hCG.  Patient agreeable to plan of care.  Molly Fernandez was evaluated in Emergency Department on 12/31/2020 for the symptoms described in the history of present illness. She was evaluated in the context of the global COVID-19 pandemic, which necessitated consideration that the patient might be at risk for infection with the SARS-CoV-2 virus that causes COVID-19. Institutional protocols and algorithms that pertain to the evaluation of patients at risk for COVID-19 are in a state of rapid change  based on information released by regulatory bodies including the CDC and federal and state organizations. These policies and algorithms were followed during the patient's care in the ED.  ____________________________________________   FINAL CLINICAL IMPRESSION(S) / ED DIAGNOSES  Threatened miscarriage   03/02/2021, MD 12/31/20 1752

## 2021-01-21 ENCOUNTER — Emergency Department
Admission: EM | Admit: 2021-01-21 | Discharge: 2021-01-21 | Disposition: A | Discharge status: Home / Self Care | Payer: Self-pay | Attending: Emergency Medicine | Admitting: Emergency Medicine

## 2021-01-21 ENCOUNTER — Other Ambulatory Visit: Payer: Self-pay

## 2021-01-21 ENCOUNTER — Emergency Department: Payer: Self-pay

## 2021-01-21 DIAGNOSIS — Z20822 Contact with and (suspected) exposure to covid-19: Secondary | ICD-10-CM | POA: Insufficient documentation

## 2021-01-21 DIAGNOSIS — Z79899 Other long term (current) drug therapy: Secondary | ICD-10-CM | POA: Insufficient documentation

## 2021-01-21 DIAGNOSIS — Z28310 Unvaccinated for covid-19: Secondary | ICD-10-CM | POA: Insufficient documentation

## 2021-01-21 DIAGNOSIS — F17210 Nicotine dependence, cigarettes, uncomplicated: Secondary | ICD-10-CM | POA: Insufficient documentation

## 2021-01-21 DIAGNOSIS — J209 Acute bronchitis, unspecified: Secondary | ICD-10-CM | POA: Insufficient documentation

## 2021-01-21 DIAGNOSIS — I10 Essential (primary) hypertension: Secondary | ICD-10-CM | POA: Insufficient documentation

## 2021-01-21 LAB — RESP PANEL BY RT-PCR (FLU A&B, COVID) ARPGX2
Influenza A by PCR: NEGATIVE
Influenza B by PCR: NEGATIVE
SARS Coronavirus 2 by RT PCR: NEGATIVE

## 2021-01-21 MED ORDER — IPRATROPIUM-ALBUTEROL 0.5-2.5 (3) MG/3ML IN SOLN
3.0000 mL | Freq: Once | RESPIRATORY_TRACT | Status: AC
  Administered 2021-01-21: 3 mL via RESPIRATORY_TRACT

## 2021-01-21 MED ORDER — GUAIFENESIN-CODEINE 100-10 MG/5ML PO SOLN: 5.0000 mL | Freq: Four times a day (QID) | ORAL | 0 refills | Status: DC | PRN

## 2021-01-21 MED ORDER — BENZONATATE 100 MG PO CAPS: 100.0000 mg | ORAL_CAPSULE | Freq: Four times a day (QID) | ORAL | 0 refills | Status: DC | PRN

## 2021-01-21 MED ORDER — METHYLPREDNISOLONE 4 MG PO TBPK: ORAL_TABLET | ORAL | 0 refills | Status: DC

## 2021-01-21 MED ORDER — ALBUTEROL SULFATE HFA 108 (90 BASE) MCG/ACT IN AERS: 2.0000 | INHALATION_SPRAY | Freq: Four times a day (QID) | RESPIRATORY_TRACT | 1 refills | Status: AC | PRN

## 2021-01-21 MED ORDER — AZITHROMYCIN 250 MG PO TABS: ORAL_TABLET | ORAL | 0 refills | Status: DC

## 2021-01-21 NOTE — ED Triage Notes (Addendum)
Pt comes with c/o cough, congestion, fever and dry nonproductive cough.  Pt states neg covid test yesterday.  Pt states headache and pressure.

## 2021-01-21 NOTE — ED Provider Notes (Signed)
[facility] Emergency Department Provider Note  ____________________________________________   Event Date/Time   First MD Initiated Contact with Patient 01/21/21 1244     (approximate)  I have reviewed the triage vital signs and the nursing notes.   HISTORY  Chief Complaint Fever and Cough    HPI Molly Fernandez is a 45 y.o. female presents to the emergency department with URI symptoms for 4 days.   Is complaining of cough, congestion, fever, chills, denies chest pain, shortness of breath no close contact with Covid19+ patient, patient is not vaccinated, patient states she got the MOA infusion when she had COVID.Marland Kitchen   Past Medical History:  Diagnosis Date  . Hypertension   . Migraine   . Migraine     There are no problems to display for this patient.   Past Surgical History:  Procedure Laterality Date  . BREAST LUMPECTOMY Left 1996  . LEG SURGERY Right     Prior to Admission medications   Medication Sig Start Date End Date Taking? Authorizing Provider  azithromycin (ZITHROMAX Z-PAK) 250 MG tablet 2 pills today then 1 pill a day for 4 days 01/21/21  Yes Cleaster Shiffer, Roselyn Bering, PA-C  benzonatate (TESSALON PERLES) 100 MG capsule Take 1 capsule (100 mg total) by mouth every 6 (six) hours as needed for cough. 01/21/21 01/21/22 Yes Eudelia Hiltunen, Roselyn Bering, PA-C  guaiFENesin-codeine 100-10 MG/5ML syrup Take 5 mLs by mouth every 6 (six) hours as needed for cough. 01/21/21  Yes Milo Solana, Roselyn Bering, PA-C  methylPREDNISolone (MEDROL DOSEPAK) 4 MG TBPK tablet Take 6 pills on day one then decrease by 1 pill each day 01/21/21  Yes Zaccheus Edmister, Roselyn Bering, PA-C  albuterol (VENTOLIN HFA) 108 (90 Base) MCG/ACT inhaler Inhale 2 puffs into the lungs every 6 (six) hours as needed for wheezing or shortness of breath. 01/21/21   Alpha Mysliwiec, Roselyn Bering, PA-C  hydrochlorothiazide (HYDRODIURIL) 25 MG tablet Take 25 mg by mouth daily.    [provider]  ibuprofen (ADVIL,MOTRIN) 800 MG tablet Take 1  tablet (800 mg total) by mouth every 8 (eight) hours as needed. 05/09/16   Beers, Charmayne Sheer, PA-C  lisinopril (PRINIVIL,ZESTRIL) 20 MG tablet Take 20 mg by mouth daily.    [provider]  metoCLOPramide (REGLAN) 10 MG tablet Take 1 tablet (10 mg total) by mouth every 8 (eight) hours as needed for nausea or vomiting. Patient not taking: Reported on 11/14/2015 02/19/15   Emily Filbert, MD  ondansetron (ZOFRAN ODT) 4 MG disintegrating tablet Take 1 tablet (4 mg total) by mouth every 8 (eight) hours as needed for nausea or vomiting. 08/18/20   Lucy Chris, PA  prochlorperazine (COMPAZINE) 10 MG tablet Take 1 tablet (10 mg total) by mouth every 6 (six) hours as needed for nausea. 11/15/15   Irean Hong, MD  traMADol (ULTRAM) 50 MG tablet Take 1 tablet (50 mg total) by mouth every 6 (six) hours as needed. 01/21/17   Triplett, Rulon Eisenmenger B, FNP    Allergies Hydrocodone-acetaminophen and Penicillins  No family history on file.  Social History Social History   Tobacco Use  . Smoking status: Current Every Day Smoker    Packs/day: 0.50    Types: Cigarettes  . Smokeless tobacco: Never Used  Substance Use Topics  . Alcohol use: Yes    Comment: Socially  . Drug use: Yes    Types: Marijuana    Review of Systems  Constitutional: Positive fever/chills Eyes: No visual changes. ENT: Denies sore  throat. Respiratory: Positive cough Cardiovascular: Denies chest pain Gastrointestinal: Denies abdominal pain Genitourinary: Negative for dysuria. Musculoskeletal: Negative for back pain. Skin: Negative for rash. Neurological: Denies neurological changes    ____________________________________________   PHYSICAL EXAM:  VITAL SIGNS: ED Triage Vitals  Enc Vitals Group     BP 01/21/21 1206 (!) 178/126     Pulse Rate 01/21/21 1206 83     Resp 01/21/21 1206 19     Temp 01/21/21 1206 98 F (36.7 C)     Temp src --      SpO2 01/21/21 1206 99 %     Weight --      Height --       Head Circumference --      Peak Flow --      Pain Score 01/21/21 1204 4     Pain Loc --      Pain Edu? --      Excl. in GC? --     Constitutional: Alert and oriented. Well appearing and in no acute distress. Eyes: Conjunctivae are normal.  Head: Atraumatic. Nose: No congestion/rhinnorhea. Mouth/Throat: Mucous membranes are moist.   Neck:  supple no lymphadenopathy noted Cardiovascular: Normal rate, regular rhythm. Heart sounds are normal Respiratory: Normal respiratory effort.  No retractions, lungs CTA GU: deferred Musculoskeletal: FROM all extremities, warm and well perfused Neurologic:  Normal speech and language.  Skin:  Skin is warm, dry and intact. No rash noted. Psychiatric: Mood and affect are normal. Speech and behavior are normal.  ____________________________________________   LABS (all labs ordered are listed, but only abnormal results are displayed)  Labs Reviewed  RESP PANEL BY RT-PCR (FLU A&B, COVID) [id number]   ____________________________________________   ____________________________________________  RADIOLOGY  Chest x-ray  ____________________________________________   PROCEDURES  Procedure(s) performed: No  Procedures    ____________________________________________   INITIAL IMPRESSION / ASSESSMENT AND PLAN / ED COURSE  Pertinent labs & imaging results that were available during my care of the patient were reviewed by me and considered in my medical decision making (see chart for details).   Patient is a 45 year old female who complains of URI symptoms.  Physical exam shows patient stable  Negative test for covid/flu  Chest x-ray reviewed by me confirmed by radiology to be negative for any acute abnormalities  Patient was given a DuoNeb prior to discharge.  She is given prescription for Z-Pak, Tessalon Perles, Robitussin-AC, methylprednisone, and an albuterol inhaler.  She is to return emergency department worsening.  Follow-up with  your regular doctor if not better in 3 days.  Discharged stable condition.    Molly Fernandez was evaluated in Emergency Department on 01/21/2021 for the symptoms described in the history of present illness. She was evaluated in the context of the global COVID-19 pandemic, which necessitated consideration that the patient might be at risk for infection with the SARS-CoV-2 virus that causes COVID-19. Institutional protocols and algorithms that pertain to the evaluation of patients at risk for COVID-19 are in a state of rapid change based on information released by regulatory bodies including the CDC and federal and state organizations. These policies and algorithms were followed during the patient's care in the ED.   As part of my medical decision making, I reviewed the following data within the electronic MEDICAL RECORD NUMBER Nursing notes reviewed and incorporated, Labs reviewed , Old chart reviewed, Radiograph reviewed , Notes from prior ED visits and  Controlled Substance Database  ____________________________________________   FINAL CLINICAL IMPRESSION(S) / ED DIAGNOSES  Final diagnoses:  Acute bronchitis, unspecified organism      NEW MEDICATIONS STARTED DURING THIS VISIT:  Discharge Medication List as of 01/21/2021  1:59 PM    START taking these medications   Details  azithromycin (ZITHROMAX Z-PAK) 250 MG tablet 2 pills today then 1 pill a day for 4 days, Normal    benzonatate (TESSALON PERLES) 100 MG capsule Take 1 capsule (100 mg total) by mouth every 6 (six) hours as needed for cough., Starting Mon 01/21/2021, Until Tue 01/21/2022 at 2359, Normal    guaiFENesin-codeine 100-10 MG/5ML syrup Take 5 mLs by mouth every 6 (six) hours as needed for cough., Starting Mon 01/21/2021, Normal    methylPREDNISolone (MEDROL DOSEPAK) 4 MG TBPK tablet Take 6 pills on day one then decrease by 1 pill each day, Normal         Note:  This document was prepared using Dragon voice recognition  software and may include unintentional dictation errors.    Faythe Ghee, PA-C 01/21/21 Joelyn Oms    Jene Every, MD 01/21/21 [phone]

## 2021-03-10 ENCOUNTER — Other Ambulatory Visit: Payer: Self-pay

## 2021-03-10 DIAGNOSIS — R079 Chest pain, unspecified: Secondary | ICD-10-CM | POA: Insufficient documentation

## 2021-03-10 DIAGNOSIS — F17210 Nicotine dependence, cigarettes, uncomplicated: Secondary | ICD-10-CM | POA: Insufficient documentation

## 2021-03-10 DIAGNOSIS — M5441 Lumbago with sciatica, right side: Secondary | ICD-10-CM | POA: Insufficient documentation

## 2021-03-10 DIAGNOSIS — Z79899 Other long term (current) drug therapy: Secondary | ICD-10-CM | POA: Insufficient documentation

## 2021-03-10 DIAGNOSIS — R202 Paresthesia of skin: Secondary | ICD-10-CM | POA: Insufficient documentation

## 2021-03-10 DIAGNOSIS — I10 Essential (primary) hypertension: Secondary | ICD-10-CM | POA: Insufficient documentation

## 2021-03-10 LAB — COMPREHENSIVE METABOLIC PANEL
ALT: 11 U/L (ref 0–44)
AST: 16 U/L (ref 15–41)
Albumin: 3.7 g/dL (ref 3.5–5.0)
Alkaline Phosphatase: 99 U/L (ref 38–126)
Anion gap: 4 — ABNORMAL LOW (ref 5–15)
BUN: 16 mg/dL (ref 6–20)
CO2: 27 mmol/L (ref 22–32)
Calcium: 9 mg/dL (ref 8.9–10.3)
Chloride: 104 mmol/L (ref 98–111)
Creatinine, Ser: 0.9 mg/dL (ref 0.44–1.00)
GFR, Estimated: >60 mL/min (ref >60)
Glucose, Bld: 102 mg/dL — ABNORMAL HIGH (ref 70–99)
Potassium: 3.7 mmol/L (ref 3.5–5.1)
Sodium: 135 mmol/L (ref 135–145)
Total Bilirubin: 0.5 mg/dL (ref 0.3–1.2)
Total Protein: 6.8 g/dL (ref 6.5–8.1)

## 2021-03-10 LAB — TROPONIN I (HIGH SENSITIVITY): Troponin I (High Sensitivity): 5 ng/L (ref <18)

## 2021-03-10 LAB — CBC
HCT: 36.8 % (ref 36.0–46.0)
Hemoglobin: 12.1 g/dL (ref 12.0–15.0)
MCH: 28.7 pg (ref 26.0–34.0)
MCHC: 32.9 g/dL (ref 30.0–36.0)
MCV: 87.4 fL (ref 80.0–100.0)
Platelets: 258 10*3/uL (ref 150–400)
RBC: 4.21 MIL/uL (ref 3.87–5.11)
RDW: 13.9 % (ref 11.5–15.5)
WBC: 8.9 10*3/uL (ref 4.0–10.5)
nRBC: 0 % (ref 0.0–0.2)

## 2021-03-10 NOTE — ED Triage Notes (Signed)
Pt in with co chest pain that started yesterday and right sided arm and leg tingling that started this am. Pt has hx of the same, states all tests were wnl.

## 2021-03-11 ENCOUNTER — Emergency Department
Admission: EM | Admit: 2021-03-11 | Discharge: 2021-03-11 | Disposition: A | Discharge status: Home / Self Care | Payer: Self-pay | Attending: Emergency Medicine | Admitting: Emergency Medicine

## 2021-03-11 DIAGNOSIS — M5441 Lumbago with sciatica, right side: Secondary | ICD-10-CM

## 2021-03-11 DIAGNOSIS — R079 Chest pain, unspecified: Secondary | ICD-10-CM

## 2021-03-11 LAB — TROPONIN I (HIGH SENSITIVITY): Troponin I (High Sensitivity): 4 ng/L (ref <18)

## 2021-03-11 MED ORDER — LIDOCAINE 5 % EX PTCH: 1.0000 | MEDICATED_PATCH | Freq: Two times a day (BID) | CUTANEOUS | 0 refills | Status: DC

## 2021-03-11 MED ORDER — NAPROXEN 500 MG PO TABS: 500.0000 mg | ORAL_TABLET | Freq: Two times a day (BID) | ORAL | 0 refills | Status: DC

## 2021-03-11 NOTE — ED Provider Notes (Signed)
[facility] Emergency Department Provider Note  ____________________________________________  Time seen: Approximately 3:12 AM  I have reviewed the triage vital signs and the nursing notes.   HISTORY  Chief Complaint Chest Pain    HPI Molly Fernandez is a 45 y.o. female with a past history of hypertension and migraine who comes ED complaining of chest pain that started yesterday, central chest, nonradiating, no shortness of breath diaphoresis or vomiting, not exertional, not pleuritic.  Feels dull.  Intermittent.  Also complains of low back pain, associated with tingling in the right leg.  Right arm is unaffected contrary to triage note.  No headache or vision changes.  No motor weakness.    Past Medical History:  Diagnosis Date   Hypertension    Migraine    Migraine      There are no problems to display for this patient.    Past Surgical History:  Procedure Laterality Date   BREAST LUMPECTOMY Left 1996   LEG SURGERY Right      Prior to Admission medications   Medication Sig Start Date End Date Taking? Authorizing Provider  lidocaine (LIDODERM) 5 % Place 1 patch onto the skin every 12 (twelve) hours. Remove & Discard patch within 12 hours or as directed by MD 03/11/21  Yes Sharman Cheek, MD  naproxen (NAPROSYN) 500 MG tablet Take 1 tablet (500 mg total) by mouth 2 (two) times daily with a meal. 03/11/21  Yes Sharman Cheek, MD  albuterol (VENTOLIN HFA) 108 (90 Base) MCG/ACT inhaler Inhale 2 puffs into the lungs every 6 (six) hours as needed for wheezing or shortness of breath. 01/21/21   Sherrie Mustache Roselyn Bering, PA-C  azithromycin (ZITHROMAX Z-PAK) 250 MG tablet 2 pills today then 1 pill a day for 4 days 01/21/21   Sherrie Mustache Roselyn Bering, PA-C  benzonatate (TESSALON PERLES) 100 MG capsule Take 1 capsule (100 mg total) by mouth every 6 (six) hours as needed for cough. 01/21/21 01/21/22  Fisher, Roselyn Bering, PA-C  guaiFENesin-codeine 100-10 MG/5ML syrup Take 5  mLs by mouth every 6 (six) hours as needed for cough. 01/21/21   Fisher, Roselyn Bering, PA-C  hydrochlorothiazide (HYDRODIURIL) 25 MG tablet Take 25 mg by mouth daily.    [provider]  ibuprofen (ADVIL,MOTRIN) 800 MG tablet Take 1 tablet (800 mg total) by mouth every 8 (eight) hours as needed. 05/09/16   Beers, Charmayne Sheer, PA-C  lisinopril (PRINIVIL,ZESTRIL) 20 MG tablet Take 20 mg by mouth daily.    [provider]  methylPREDNISolone (MEDROL DOSEPAK) 4 MG TBPK tablet Take 6 pills on day one then decrease by 1 pill each day 01/21/21   Faythe Ghee, PA-C  metoCLOPramide (REGLAN) 10 MG tablet Take 1 tablet (10 mg total) by mouth every 8 (eight) hours as needed for nausea or vomiting. Patient not taking: Reported on 11/14/2015 02/19/15   Emily Filbert, MD  ondansetron (ZOFRAN ODT) 4 MG disintegrating tablet Take 1 tablet (4 mg total) by mouth every 8 (eight) hours as needed for nausea or vomiting. 08/18/20   Lucy Chris, PA  prochlorperazine (COMPAZINE) 10 MG tablet Take 1 tablet (10 mg total) by mouth every 6 (six) hours as needed for nausea. 11/15/15   Irean Hong, MD  traMADol (ULTRAM) 50 MG tablet Take 1 tablet (50 mg total) by mouth every 6 (six) hours as needed. 01/21/17   Triplett, Rulon Eisenmenger B, FNP     Allergies Hydrocodone-acetaminophen and Penicillins   No family history on file.  Social History Social History   Tobacco Use   Smoking status: Every Day    Packs/day: 0.50    Types: Cigarettes   Smokeless tobacco: Never  Substance Use Topics   Alcohol use: Yes    Comment: Socially   Drug use: Yes    Types: Marijuana    Review of Systems  Constitutional:   No fever or chills.  ENT:   No sore throat. No rhinorrhea. Cardiovascular: Positive chest pain as above without syncope. Respiratory:   No dyspnea or cough. Gastrointestinal:   Negative for abdominal pain, vomiting and diarrhea.  Musculoskeletal:   Positive low back pain and right leg tingling All  other systems reviewed and are negative except as documented above in ROS and HPI.  ____________________________________________   PHYSICAL EXAM:  VITAL SIGNS: ED Triage Vitals [03/10/21 2231]  Enc Vitals Group     BP (!) 186/115     Pulse Rate 82     Resp 20     Temp 97.8 F (36.6 C)     Temp Source Oral     SpO2 98 %     Weight 220 lb (99.8 kg)     Height 5\' 3"  (1.6 m)     Head Circumference      Peak Flow      Pain Score 6     Pain Loc      Pain Edu?      Excl. in GC?     Vital signs reviewed, nursing assessments reviewed.   Constitutional:   Alert and oriented. Non-toxic appearance. Eyes:   Conjunctivae are normal. EOMI. PERRL. ENT      Head:   Normocephalic and atraumatic.      Nose:   Wearing a mask.      Mouth/Throat:   Wearing a mask.      Neck:   No meningismus. Full ROM. Hematological/Lymphatic/Immunilogical:   No cervical lymphadenopathy. Cardiovascular:   RRR. Symmetric bilateral radial and DP pulses.  No murmurs. Cap refill less than 2 seconds. Respiratory:   Normal respiratory effort without tachypnea/retractions. Breath sounds are clear and equal bilaterally. No wheezes/rales/rhonchi. Gastrointestinal:   Soft and nontender. Non distended. There is no CVA tenderness.  No rebound, rigidity, or guarding. Genitourinary:   deferred Musculoskeletal:   Normal range of motion in all extremities.  There is tenderness in the musculature of the right lower back.  Straight leg raise on the right is positive reproducing the pain and symptoms. Neurologic:   Normal speech and language.  Motor grossly intact. No acute focal neurologic deficits are appreciated.  Skin:    Skin is warm, dry and intact. No rash noted.  No petechiae, purpura, or bullae.  ____________________________________________    LABS (pertinent positives/negatives) (all labs ordered are listed, but only abnormal results are displayed) Labs Reviewed  COMPREHENSIVE METABOLIC PANEL - Abnormal;  Notable for the following components:      Result Value   Glucose, Bld 102 (*)    Anion gap 4 (*)    All other components within normal limits  CBC  TROPONIN I (HIGH SENSITIVITY)  TROPONIN I (HIGH SENSITIVITY)   ____________________________________________   EKG  Interpreted by me Normal sinus rhythm rate of 89, normal axis and intervals.  Poor R wave progression.  Normal ST segments and T waves  ____________________________________________    RADIOLOGY  No results found.  ____________________________________________   PROCEDURES Procedures  ____________________________________________  DIFFERENTIAL DIAGNOSIS   Sciatica, non-STEMI, anemia, renal failure  CLINICAL IMPRESSION /  ASSESSMENT AND PLAN / ED COURSE  Medications ordered in the ED: Medications - No data to display  Pertinent labs & imaging results that were available during my care of the patient were reviewed by me and considered in my medical decision making (see chart for details).  Neal L Matas was evaluated in Emergency Department on 03/11/2021 for the symptoms described in the history of present illness. She was evaluated in the context of the global COVID-19 pandemic, which necessitated consideration that the patient might be at risk for infection with the SARS-CoV-2 virus that causes COVID-19. Institutional protocols and algorithms that pertain to the evaluation of patients at risk for COVID-19 are in a state of rapid change based on information released by regulatory bodies including the CDC and federal and state organizations. These policies and algorithms were followed during the patient's care in the ED.   Patient presents with low back pain radiating into her right leg that is clinically consistent with sciatica on my exam.  She is neurologically intact.  We will treat with NSAIDs, Lidoderm, heat therapy, exercises.  Also reports a recurrence of atypical chest pain which is noncardiac.  Troponins  negative, EKG is unremarkable, exam is reassuring.  Stable for discharge.  Doubt ACS PE dissection AAA cauda equina or central cord injury      ____________________________________________   FINAL CLINICAL IMPRESSION(S) / ED DIAGNOSES    Final diagnoses:  Acute back pain with sciatica, right  Nonspecific chest pain     ED Discharge Orders          Ordered    naproxen (NAPROSYN) 500 MG tablet  2 times daily with meals        03/11/21 0311    lidocaine (LIDODERM) 5 %  Every 12 hours        03/11/21 0311            Portions of this note were generated with dragon dictation software. Dictation errors may occur despite best attempts at proofreading.    Sharman Cheek, MD 03/11/21 [phone]

## 2021-05-29 ENCOUNTER — Emergency Department
Admission: EM | Admit: 2021-05-29 | Discharge: 2021-05-29 | Disposition: A | Discharge status: Home / Self Care | Payer: Self-pay | Attending: Emergency Medicine | Admitting: Emergency Medicine

## 2021-05-29 ENCOUNTER — Emergency Department: Payer: Self-pay

## 2021-05-29 ENCOUNTER — Other Ambulatory Visit: Payer: Self-pay

## 2021-05-29 DIAGNOSIS — O209 Hemorrhage in early pregnancy, unspecified: Secondary | ICD-10-CM | POA: Insufficient documentation

## 2021-05-29 DIAGNOSIS — N939 Abnormal uterine and vaginal bleeding, unspecified: Secondary | ICD-10-CM

## 2021-05-29 DIAGNOSIS — I10 Essential (primary) hypertension: Secondary | ICD-10-CM

## 2021-05-29 DIAGNOSIS — R102 Pelvic and perineal pain: Secondary | ICD-10-CM | POA: Insufficient documentation

## 2021-05-29 DIAGNOSIS — Z3A00 Weeks of gestation of pregnancy not specified: Secondary | ICD-10-CM | POA: Insufficient documentation

## 2021-05-29 LAB — CBC
HCT: 42.3 % (ref 36.0–46.0)
Hemoglobin: 14.2 g/dL (ref 12.0–15.0)
MCH: 29 pg (ref 26.0–34.0)
MCHC: 33.6 g/dL (ref 30.0–36.0)
MCV: 86.5 fL (ref 80.0–100.0)
Platelets: 289 10*3/uL (ref 150–400)
RBC: 4.89 MIL/uL (ref 3.87–5.11)
RDW: 14.6 % (ref 11.5–15.5)
WBC: 9.6 10*3/uL (ref 4.0–10.5)
nRBC: 0 % (ref 0.0–0.2)

## 2021-05-29 LAB — COMPREHENSIVE METABOLIC PANEL
ALT: 15 U/L (ref 0–44)
AST: 18 U/L (ref 15–41)
Albumin: 4.3 g/dL (ref 3.5–5.0)
Alkaline Phosphatase: 113 U/L (ref 38–126)
Anion gap: 8 (ref 5–15)
BUN: 8 mg/dL (ref 6–20)
CO2: 25 mmol/L (ref 22–32)
Calcium: 9.1 mg/dL (ref 8.9–10.3)
Chloride: 101 mmol/L (ref 98–111)
Creatinine, Ser: 0.85 mg/dL (ref 0.44–1.00)
GFR, Estimated: >60 mL/min (ref >60)
Glucose, Bld: 89 mg/dL (ref 70–99)
Potassium: 3.1 mmol/L — ABNORMAL LOW (ref 3.5–5.1)
Sodium: 134 mmol/L — ABNORMAL LOW (ref 135–145)
Total Bilirubin: 0.6 mg/dL (ref 0.3–1.2)
Total Protein: 8.2 g/dL — ABNORMAL HIGH (ref 6.5–8.1)

## 2021-05-29 LAB — ABO/RH: ABO/RH(D): O POS

## 2021-05-29 LAB — HCG, QUANTITATIVE, PREGNANCY: hCG, Beta Chain, Quant, S: <1 m[IU]/mL (ref <5)

## 2021-05-29 MED ORDER — LISINOPRIL 10 MG PO TABS
20.0000 mg | ORAL_TABLET | Freq: Once | ORAL | Status: AC
  Administered 2021-05-29: 20 mg via ORAL
  Filled 2021-05-29: qty 2

## 2021-05-29 MED ORDER — POTASSIUM CHLORIDE CRYS ER 20 MEQ PO TBCR
10.0000 meq | EXTENDED_RELEASE_TABLET | Freq: Once | ORAL | Status: AC
  Administered 2021-05-29: 10 meq via ORAL
  Filled 2021-05-29: qty 1

## 2021-05-29 MED ORDER — CLONIDINE HCL 0.1 MG PO TABS
0.1000 mg | ORAL_TABLET | Freq: Once | ORAL | Status: AC
  Administered 2021-05-29: 0.1 mg via ORAL
  Filled 2021-05-29: qty 1

## 2021-05-29 NOTE — Discharge Instructions (Signed)
Please follow-up with your primary care provider about elevated blood pressure in the emergency department.

## 2021-05-29 NOTE — ED Triage Notes (Signed)
C/O vaginal bleeding today.  LMP:  03/14/2021.  Has taken 2 home pregnancy tests, one was positive, one was negative.  Reports vaginal bleeding today.  Reprots heavy vaginal bleeding 5-6 pads over past 2 hours.  AAOx3.  Skin warm and dry. NAD

## 2021-05-29 NOTE — ED Triage Notes (Addendum)
Pt comes pov with vaginal bleeding. See first nurse note. Trying for children/ Has 4 living children, 1 miscarriage in May. Pt had a day of bleeding and positive test in august-unsure if miscarriage. Positive test at home September 1st.

## 2021-05-29 NOTE — ED Notes (Signed)
ABO/Rh sent if needed

## 2021-05-29 NOTE — ED Provider Notes (Signed)
[facility]-EMERGENCY DEPARTMENT  ____________________________________________  Time seen: Approximately 9:24 PM  I have reviewed the triage vital signs and the nursing notes.   HISTORY  Chief Complaint Vaginal Bleeding   Historian Patient     HPI Molly Fernandez is a 45 y.o. female presents to the emergency department with concern for vaginal bleeding that started today.  Patient states that she was approximately 2 weeks late to starting her period and started having vaginal bleeding today consistent with onset of her menses.  Patient states that she took 2 pregnancy test at home and one was positive and 1 was negative and she became concerned for possible miscarriage.  She has had some mild pelvic cramping consistent with her.  But no significant abdominal pain or flank pain.  No nausea, vomiting or diarrhea.  No fever or chills.   Past Medical History:  Diagnosis Date   Hypertension    Migraine    Migraine      Immunizations up to date:  Yes.     Past Medical History:  Diagnosis Date   Hypertension    Migraine    Migraine     There are no problems to display for this patient.   Past Surgical History:  Procedure Laterality Date   BREAST LUMPECTOMY Left 1996   LEG SURGERY Right     Prior to Admission medications   Medication Sig Start Date End Date Taking? Authorizing Provider  albuterol (VENTOLIN HFA) 108 (90 Base) MCG/ACT inhaler Inhale 2 puffs into the lungs every 6 (six) hours as needed for wheezing or shortness of breath. 01/21/21   Sherrie Mustache Roselyn Bering, PA-C  azithromycin (ZITHROMAX Z-PAK) 250 MG tablet 2 pills today then 1 pill a day for 4 days 01/21/21   Sherrie Mustache Roselyn Bering, PA-C  benzonatate (TESSALON PERLES) 100 MG capsule Take 1 capsule (100 mg total) by mouth every 6 (six) hours as needed for cough. 01/21/21 01/21/22  Fisher, Roselyn Bering, PA-C  guaiFENesin-codeine 100-10 MG/5ML syrup Take 5 mLs by mouth every 6 (six) hours as needed for cough. 01/21/21   Fisher, Roselyn Bering, PA-C  hydrochlorothiazide (HYDRODIURIL) 25 MG tablet Take 25 mg by mouth daily.    [provider]  ibuprofen (ADVIL,MOTRIN) 800 MG tablet Take 1 tablet (800 mg total) by mouth every 8 (eight) hours as needed. 05/09/16   Beers, Charmayne Sheer, PA-C  lidocaine (LIDODERM) 5 % Place 1 patch onto the skin every 12 (twelve) hours. Remove & Discard patch within 12 hours or as directed by MD 03/11/21   Sharman Cheek, MD  lisinopril (PRINIVIL,ZESTRIL) 20 MG tablet Take 20 mg by mouth daily.    [provider]  methylPREDNISolone (MEDROL DOSEPAK) 4 MG TBPK tablet Take 6 pills on day one then decrease by 1 pill each day 01/21/21   Faythe Ghee, PA-C  metoCLOPramide (REGLAN) 10 MG tablet Take 1 tablet (10 mg total) by mouth every 8 (eight) hours as needed for nausea or vomiting. Patient not taking: Reported on 11/14/2015 02/19/15   Emily Filbert, MD  naproxen (NAPROSYN) 500 MG tablet Take 1 tablet (500 mg total) by mouth 2 (two) times daily with a meal. 03/11/21   Sharman Cheek, MD  ondansetron (ZOFRAN ODT) 4 MG disintegrating tablet Take 1 tablet (4 mg total) by mouth every 8 (eight) hours as needed for nausea or vomiting. 08/18/20   Lucy Chris, PA  prochlorperazine (COMPAZINE) 10 MG tablet Take 1 tablet (10 mg total) by mouth every 6 (six) hours  as needed for nausea. 11/15/15   Irean Hong, MD  traMADol (ULTRAM) 50 MG tablet Take 1 tablet (50 mg total) by mouth every 6 (six) hours as needed. 01/21/17   Triplett, Rulon Eisenmenger B, FNP    Allergies Hydrocodone-acetaminophen and Penicillins  History reviewed. No pertinent family history.  Social History Social History   Tobacco Use   Smoking status: Every Day    Packs/day: 0.50    Types: Cigarettes   Smokeless tobacco: Never  Substance Use Topics   Alcohol use: Yes    Comment: Socially   Drug use: Yes    Types: Marijuana     Review of Systems  Constitutional: No fever/chills Eyes:  No discharge ENT: No upper  respiratory complaints. Respiratory: no cough. No SOB/ use of accessory muscles to breath Gastrointestinal:   No nausea, no vomiting.  No diarrhea.  No constipation. Genitourinary: Patient has pelvic cramping.  Musculoskeletal: Negative for musculoskeletal pain. Skin: Negative for rash, abrasions, lacerations, ecchymosis.   ____________________________________________   PHYSICAL EXAM:  VITAL SIGNS: ED Triage Vitals  Enc Vitals Group     BP 05/29/21 1656 (S) (!) 203/117     Pulse Rate 05/29/21 1656 84     Resp 05/29/21 1656 18     Temp 05/29/21 1656 98.3 F (36.8 C)     Temp Source 05/29/21 1656 Oral     SpO2 05/29/21 1656 94 %     Weight 05/29/21 1653 230 lb (104.3 kg)     Height 05/29/21 1653 5\' 3"  (1.6 m)     Head Circumference --      Peak Flow --      Pain Score 05/29/21 1653 8     Pain Loc --      Pain Edu? --      Excl. in GC? --      Constitutional: Alert and oriented. Well appearing and in no acute distress. Eyes: Conjunctivae are normal. PERRL. EOMI. Head: Atraumatic. ENT:      Nose: No congestion/rhinnorhea.      Mouth/Throat: Mucous membranes are moist.  Neck: No stridor.  No cervical spine tenderness to palpation. Cardiovascular: Normal rate, regular rhythm. Normal S1 and S2.  Good peripheral circulation. Respiratory: Normal respiratory effort without tachypnea or retractions. Lungs CTAB. Good air entry to the bases with no decreased or absent breath sounds Gastrointestinal: Bowel sounds x 4 quadrants. Soft and nontender to palpation. No guarding or rigidity. No distention. Musculoskeletal: Full range of motion to all extremities. No obvious deformities noted Neurologic:  Normal for age. No gross focal neurologic deficits are appreciated.  Skin:  Skin is warm, dry and intact. No rash noted. Psychiatric: Mood and affect are normal for age. Speech and behavior are normal.   ____________________________________________   LABS (all labs ordered are listed,  but only abnormal results are displayed)  Labs Reviewed  COMPREHENSIVE METABOLIC PANEL - Abnormal; Notable for the following components:      Result Value   Sodium 134 (*)    Potassium 3.1 (*)    Total Protein 8.2 (*)    All other components within normal limits  CBC  HCG, QUANTITATIVE, PREGNANCY  ABO/RH   ____________________________________________  EKG   ____________________________________________  RADIOLOGY 07/29/21, personally viewed and evaluated these images (plain radiographs) as part of my medical decision making, as well as reviewing the written report by the radiologist.  Geraldo Pitter PELVIC COMPLETE WITH TRANSVAGINAL  Result Date: 05/29/2021 CLINICAL DATA:  Heavy vaginal bleeding. EXAM: TRANSABDOMINAL  ULTRASOUND OF PELVIS TECHNIQUE: Transabdominal ultrasound examination of the pelvis was performed including evaluation of the uterus, ovaries, adnexal regions, and pelvic cul-de-sac. COMPARISON:  Dec 31, 2020. FINDINGS: Uterus Measurements: 9.4 x 5.6 x 6.2 cm = volume: 171 mL. No fibroids or other mass visualized. Endometrium Thickness: 6 mm which is within normal limits. No focal abnormality visualized. Right ovary Measurements: 2.0 x 2.7 x 1.8 cm = volume: 5 mL. Normal appearance/no adnexal mass. Left ovary Measurements: 2.1 x 1.8 x 1.8 cm = volume: 4 mL. Normal appearance/no adnexal mass. Other findings:  No abnormal free fluid. IMPRESSION: No definite abnormality seen in the pelvis. Electronically Signed   By: Lupita Raider M.D.   On: 05/29/2021 18:56    ____________________________________________    PROCEDURES  Procedure(s) performed:     Procedures     Medications  lisinopril (ZESTRIL) tablet 20 mg (has no administration in time range)  potassium chloride SA (KLOR-CON) CR tablet 10 mEq (has no administration in time range)     ____________________________________________   INITIAL IMPRESSION / ASSESSMENT AND PLAN / ED COURSE  Pertinent labs &  imaging results that were available during my care of the patient were reviewed by me and considered in my medical decision making (see chart for details).      Assessment and plan Vaginal bleeding Pelvic cramping 45 year old female presents to the emergency department with vaginal bleeding started today consistent with onset of menses.  Beta-hCG was less than 1 and pelvic ultrasound showed no acute abnormality seen within the pelvis.  Communicated to patient that onset of vaginal bleeding was consistent with menses.  Patient was given lisinopril and potassium chloride in the emergency department as she had mild hypokalemia and her blood pressure was 203/117.  Patient's blood pressure trended down with lisinopril and Catapres given in the emergency department.  Patient states that she has not had her nighttime medications.  Did recommend that patient follow-up with her primary care if blood pressure remains elevated at home.     ____________________________________________  FINAL CLINICAL IMPRESSION(S) / ED DIAGNOSES  Final diagnoses:  Vaginal bleeding affecting early pregnancy      NEW MEDICATIONS STARTED DURING THIS VISIT:  ED Discharge Orders     None           This chart was dictated using voice recognition software/Dragon. Despite best efforts to proofread, errors can occur which can change the meaning. Any change was purely unintentional.     Orvil Feil, PA-C 05/29/21 2235    Merwyn Katos, MD 06/04/21 2100

## 2021-07-19 ENCOUNTER — Other Ambulatory Visit: Payer: Self-pay

## 2021-07-19 ENCOUNTER — Emergency Department: Payer: Self-pay

## 2021-07-19 ENCOUNTER — Emergency Department
Admission: EM | Admit: 2021-07-19 | Discharge: 2021-07-19 | Disposition: A | Discharge status: Home / Self Care | Payer: Self-pay | Attending: Emergency Medicine | Admitting: Emergency Medicine

## 2021-07-19 ENCOUNTER — Encounter: Payer: Self-pay | Admitting: Emergency Medicine

## 2021-07-19 DIAGNOSIS — F17210 Nicotine dependence, cigarettes, uncomplicated: Secondary | ICD-10-CM | POA: Insufficient documentation

## 2021-07-19 DIAGNOSIS — R0600 Dyspnea, unspecified: Secondary | ICD-10-CM | POA: Insufficient documentation

## 2021-07-19 DIAGNOSIS — R059 Cough, unspecified: Secondary | ICD-10-CM | POA: Insufficient documentation

## 2021-07-19 DIAGNOSIS — Z79899 Other long term (current) drug therapy: Secondary | ICD-10-CM | POA: Insufficient documentation

## 2021-07-19 DIAGNOSIS — Z20822 Contact with and (suspected) exposure to covid-19: Secondary | ICD-10-CM | POA: Insufficient documentation

## 2021-07-19 DIAGNOSIS — I10 Essential (primary) hypertension: Secondary | ICD-10-CM | POA: Insufficient documentation

## 2021-07-19 DIAGNOSIS — R079 Chest pain, unspecified: Secondary | ICD-10-CM

## 2021-07-19 LAB — CBC WITH DIFFERENTIAL/PLATELET
Abs Immature Granulocytes: 0.04 10*3/uL (ref 0.00–0.07)
Basophils Absolute: 0 10*3/uL (ref 0.0–0.1)
Basophils Relative: 0 %
Eosinophils Absolute: 0 10*3/uL (ref 0.0–0.5)
Eosinophils Relative: 0 %
HCT: 37.1 % (ref 36.0–46.0)
Hemoglobin: 12.2 g/dL (ref 12.0–15.0)
Immature Granulocytes: 0 %
Lymphocytes Relative: 7 %
Lymphs Abs: 0.7 10*3/uL (ref 0.7–4.0)
MCH: 28.2 pg (ref 26.0–34.0)
MCHC: 32.9 g/dL (ref 30.0–36.0)
MCV: 85.9 fL (ref 80.0–100.0)
Monocytes Absolute: 0.9 10*3/uL (ref 0.1–1.0)
Monocytes Relative: 10 %
Neutro Abs: 7.4 10*3/uL (ref 1.7–7.7)
Neutrophils Relative %: 83 %
Platelets: 271 10*3/uL (ref 150–400)
RBC: 4.32 MIL/uL (ref 3.87–5.11)
RDW: 14.6 % (ref 11.5–15.5)
WBC: 9.1 10*3/uL (ref 4.0–10.5)
nRBC: 0 % (ref 0.0–0.2)

## 2021-07-19 LAB — COMPREHENSIVE METABOLIC PANEL
ALT: 13 U/L (ref 0–44)
AST: 16 U/L (ref 15–41)
Albumin: 3.8 g/dL (ref 3.5–5.0)
Alkaline Phosphatase: 100 U/L (ref 38–126)
Anion gap: 7 (ref 5–15)
BUN: 14 mg/dL (ref 6–20)
CO2: 22 mmol/L (ref 22–32)
Calcium: 8.7 mg/dL — ABNORMAL LOW (ref 8.9–10.3)
Chloride: 106 mmol/L (ref 98–111)
Creatinine, Ser: 0.69 mg/dL (ref 0.44–1.00)
GFR, Estimated: >60 mL/min (ref >60)
Glucose, Bld: 94 mg/dL (ref 70–99)
Potassium: 3.3 mmol/L — ABNORMAL LOW (ref 3.5–5.1)
Sodium: 135 mmol/L (ref 135–145)
Total Bilirubin: 0.5 mg/dL (ref 0.3–1.2)
Total Protein: 7.4 g/dL (ref 6.5–8.1)

## 2021-07-19 LAB — RESP PANEL BY RT-PCR (FLU A&B, COVID) ARPGX2
Influenza A by PCR: POSITIVE — AB
Influenza B by PCR: NEGATIVE
SARS Coronavirus 2 by RT PCR: NEGATIVE

## 2021-07-19 LAB — TSH: TSH: 0.245 u[IU]/mL — ABNORMAL LOW (ref 0.350–4.500)

## 2021-07-19 LAB — HCG, QUANTITATIVE, PREGNANCY: hCG, Beta Chain, Quant, S: <1 m[IU]/mL (ref <5)

## 2021-07-19 LAB — BRAIN NATRIURETIC PEPTIDE: B Natriuretic Peptide: 30.1 pg/mL (ref 0.0–100.0)

## 2021-07-19 LAB — LIPASE, BLOOD: Lipase: 30 U/L (ref 11–51)

## 2021-07-19 LAB — TROPONIN I (HIGH SENSITIVITY): Troponin I (High Sensitivity): 4 ng/L (ref <18)

## 2021-07-19 LAB — T4, FREE: Free T4: 0.81 ng/dL (ref 0.61–1.12)

## 2021-07-19 NOTE — ED Provider Notes (Signed)
[facility]  ____________________________________________   Event Date/Time   First MD Initiated Contact with Patient 07/19/21 1957     (approximate)  I have reviewed the triage vital signs and the nursing notes.   HISTORY  Chief Complaint Chest Pain and Shortness of Breath    HPI Molly Fernandez is a 45 y.o. female past medical history of hypertension who presents with chest pain.  Symptoms started this morning.  She endorses a sharp pain in the center of her chest that is constant.  Nonradiating nonexertional nonpleuritic.  She does have some associated dyspnea but denies nausea or diaphoresis.  Never had the symptoms before.  She does have some cough and chills no fevers.  Patient denies abdominal pain or focal swelling or pain in her extremities.  The patient denies hx of prior DVT/PE, unilateral leg pain/swelling, hormone use, recent surgery, hx of cancer, prolonged immobilization, or hemoptysis.           Past Medical History:  Diagnosis Date   Hypertension    Migraine    Migraine     There are no problems to display for this patient.   Past Surgical History:  Procedure Laterality Date   BREAST LUMPECTOMY Left 1996   LEG SURGERY Right     Prior to Admission medications   Medication Sig Start Date End Date Taking? Authorizing Provider  albuterol (VENTOLIN HFA) 108 (90 Base) MCG/ACT inhaler Inhale 2 puffs into the lungs every 6 (six) hours as needed for wheezing or shortness of breath. 01/21/21   Sherrie Mustache Roselyn Bering, PA-C  azithromycin (ZITHROMAX Z-PAK) 250 MG tablet 2 pills today then 1 pill a day for 4 days 01/21/21   Sherrie Mustache Roselyn Bering, PA-C  benzonatate (TESSALON PERLES) 100 MG capsule Take 1 capsule (100 mg total) by mouth every 6 (six) hours as needed for cough. 01/21/21 01/21/22  Fisher, Roselyn Bering, PA-C  guaiFENesin-codeine 100-10 MG/5ML syrup Take 5 mLs by mouth every 6 (six) hours as needed for cough. 01/21/21   Fisher, Roselyn Bering, PA-C   hydrochlorothiazide (HYDRODIURIL) 25 MG tablet Take 25 mg by mouth daily.    [provider]  ibuprofen (ADVIL,MOTRIN) 800 MG tablet Take 1 tablet (800 mg total) by mouth every 8 (eight) hours as needed. 05/09/16   Beers, Charmayne Sheer, PA-C  lidocaine (LIDODERM) 5 % Place 1 patch onto the skin every 12 (twelve) hours. Remove & Discard patch within 12 hours or as directed by MD 03/11/21   Sharman Cheek, MD  lisinopril (PRINIVIL,ZESTRIL) 20 MG tablet Take 20 mg by mouth daily.    [provider]  methylPREDNISolone (MEDROL DOSEPAK) 4 MG TBPK tablet Take 6 pills on day one then decrease by 1 pill each day 01/21/21   Faythe Ghee, PA-C  metoCLOPramide (REGLAN) 10 MG tablet Take 1 tablet (10 mg total) by mouth every 8 (eight) hours as needed for nausea or vomiting. Patient not taking: Reported on 11/14/2015 02/19/15   Emily Filbert, MD  naproxen (NAPROSYN) 500 MG tablet Take 1 tablet (500 mg total) by mouth 2 (two) times daily with a meal. 03/11/21   Sharman Cheek, MD  ondansetron (ZOFRAN ODT) 4 MG disintegrating tablet Take 1 tablet (4 mg total) by mouth every 8 (eight) hours as needed for nausea or vomiting. 08/18/20   Lucy Chris, PA  prochlorperazine (COMPAZINE) 10 MG tablet Take 1 tablet (10 mg total) by mouth every 6 (six) hours as needed for nausea. 11/15/15   Dolores Frame,  Walsenburg Sink, MD  traMADol (ULTRAM) 50 MG tablet Take 1 tablet (50 mg total) by mouth every 6 (six) hours as needed. 01/21/17   Triplett, Rulon Eisenmenger B, FNP    Allergies Hydrocodone-acetaminophen and Penicillins  No family history on file.  Social History Social History   Tobacco Use   Smoking status: Every Day    Packs/day: 0.50    Types: Cigarettes   Smokeless tobacco: Never  Substance Use Topics   Alcohol use: Yes    Comment: Socially   Drug use: Yes    Types: Marijuana    Review of Systems   Review of Systems  Constitutional:  Positive for appetite change and chills. Negative for fever.   Respiratory:  Positive for cough, chest tightness and shortness of breath.   Cardiovascular:  Positive for chest pain. Negative for palpitations and leg swelling.  Gastrointestinal:  Negative for abdominal pain, nausea and vomiting.  All other systems reviewed and are negative.  Physical Exam Updated Vital Signs BP (!) 191/148   Pulse 91   Temp 98.8 F (37.1 C) (Oral)   Resp (!) 80   Ht 5\' 3"  (1.6 m)   Wt 105 kg   LMP 07/19/2021 (Exact Date)   SpO2 100%   BMI 41.01 kg/m   Physical Exam Vitals and nursing note reviewed.  Constitutional:      General: She is not in acute distress.    Appearance: Normal appearance.  HENT:     Head: Normocephalic and atraumatic.  Eyes:     General: No scleral icterus.    Conjunctiva/sclera: Conjunctivae normal.  Cardiovascular:     Heart sounds: Normal heart sounds.  Pulmonary:     Effort: Pulmonary effort is normal. No respiratory distress.     Breath sounds: No stridor. No wheezing.  Abdominal:     Palpations: Abdomen is soft. There is no mass.     Tenderness: There is no abdominal tenderness. There is no guarding.  Musculoskeletal:        General: No deformity or signs of injury.     Cervical back: Normal range of motion.     Right lower leg: No edema.     Left lower leg: No edema.  Skin:    General: Skin is dry.     Coloration: Skin is not jaundiced or pale.  Neurological:     General: No focal deficit present.     Mental Status: She is alert and oriented to person, place, and time. Mental status is at baseline.  Psychiatric:        Mood and Affect: Mood normal.        Behavior: Behavior normal.     LABS (all labs ordered are listed, but only abnormal results are displayed)  Labs Reviewed  RESP PANEL BY RT-PCR (FLU A&B, COVID) ARPGX2 - Abnormal; Notable for the following components:      Result Value   Influenza A by PCR POSITIVE (*)    All other components within normal limits  COMPREHENSIVE METABOLIC PANEL - Abnormal;  Notable for the following components:   Potassium 3.3 (*)    Calcium 8.7 (*)    All other components within normal limits  TSH - Abnormal; Notable for the following components:   TSH 0.245 (*)    All other components within normal limits  CBC WITH DIFFERENTIAL/PLATELET  BRAIN NATRIURETIC PEPTIDE  HCG, QUANTITATIVE, PREGNANCY  T4, FREE  LIPASE, BLOOD  URINALYSIS, ROUTINE W REFLEX MICROSCOPIC  TROPONIN I (HIGH SENSITIVITY)  TROPONIN I (HIGH SENSITIVITY)   ____________________________________________  EKG NSR, nml axis, nml intervals, no acute ischemic changes, LVH  ____________________________________________  RADIOLOGY I, Randol Kern, personally viewed and evaluated these images (plain radiographs) as part of my medical decision making, as well as reviewing the written report by the radiologist.  ED MD interpretation:  I reviewed the CXR which does not show any acute cardiopulmonary process      ____________________________________________   PROCEDURES  Procedure(s) performed (including Critical Care):  Procedures   ____________________________________________   INITIAL IMPRESSION / ASSESSMENT AND PLAN / ED COURSE     45 year old female presenting with chest pain cough and chills.  Vital signs are notable for hypertension but otherwise within normal limits.  She endorses a constant sharp pain in the center of her chest that has been going on all day with associated dyspnea but no other associated symptoms.  Lung sounds are clear abdomen is benign no swelling in her extremities.  Her EKG shows LVH but no acute ischemic changes.  Chest x-ray is without infiltrate.  Her symptoms sound somewhat viral to me and she is flu positive's I suspect that this is causing some pleurisy and the pain that she is having.  Her troponin is negative and given the symptoms of been going on all day and have been constant I feel that is unlikely to be ACS.  Consider pulmonary embolism  but patient is PERC negative she is not hypoxic nor she tachycardic.  I did discuss with the patient her elevated blood pressure she says she is compliant with her lisinopril.  I recommended that she follow-up with her primary care provider as she may need to be started on additional agent.  She is stable for discharge.      ____________________________________________   FINAL CLINICAL IMPRESSION(S) / ED DIAGNOSES  Final diagnoses:  Chest pain, unspecified type  Hypertension, unspecified type     ED Discharge Orders     None        Note:  This document was prepared using Dragon voice recognition software and may include unintentional dictation errors.    Georga Hacking, MD 07/19/21 [phone]

## 2021-07-19 NOTE — Discharge Instructions (Signed)
Your EKG, chest x-ray and blood work were all reassuring today.  You likely have a viral infection which is causing your symptoms.  If your symptoms are worsening, please return to the emergency department.  Please also follow-up with your primary care provider regarding your elevated blood pressure.

## 2021-07-19 NOTE — ED Provider Notes (Signed)
HPI: Pt is a 45 y.o. female who presents with complaints of chest pain since waking.   The patient p/w   chest pain. Started this AM.  Fatigue for 2 weeks.   ROS: Denies fever, chest pain, vomiting  Past Medical History:  Diagnosis Date   Hypertension    Migraine    Migraine    Vitals:   07/19/21 1546  BP: (!) 172/120  Pulse: 91  Resp: 20  Temp: 98.5 F (36.9 C)  SpO2: 98%    Focused Physical Exam: Gen: No acute distress Head: atraumatic, normocephalic Eyes: Extraocular movements grossly intact; conjunctiva clear CV: RRR Lung: No increased WOB, no stridor GI: ND, no obvious masses Neuro: Alert and awake  Medical Decision Making and Plan: Given the patient's initial medical screening exam, the following diagnostic evaluation has been ordered. The patient will be placed in the appropriate treatment space, once one is available, to complete the evaluation and treatment. I have discussed the plan of care with the patient and I have advised the patient that an ED physician or mid-level practitioner will reevaluate their condition after the test results have been received, as the results may give them additional insight into the type of treatment they may need.   Diagnostics: labs, xray   Treatments: none immediately   Concha Se, MD 07/19/21 1550

## 2021-07-19 NOTE — ED Triage Notes (Signed)
Pt reports pain to her mid chest this am that is sharp in nature and some SOB. Pt reports was told she had a tear in her heart years ago. Pt reports feels weak

## 2021-07-19 NOTE — ED Triage Notes (Signed)
Pt in via EMS from work with c/o CP since waking this am. Pt also with some anxiety at work. Pt describes the pain as pressure like. 200/100, pt was given 324mg  asa and nitro sublingual x's 2, zofran 4mg  and pt has #20g to left hand

## 2021-10-22 ENCOUNTER — Ambulatory Visit: Payer: Self-pay

## 2021-10-23 ENCOUNTER — Ambulatory Visit: Payer: Self-pay

## 2021-10-26 ENCOUNTER — Other Ambulatory Visit: Payer: Self-pay

## 2021-10-26 ENCOUNTER — Emergency Department
Admission: EM | Admit: 2021-10-26 | Discharge: 2021-10-26 | Disposition: A | Discharge status: Home / Self Care | Payer: Self-pay | Attending: Emergency Medicine | Admitting: Emergency Medicine

## 2021-10-26 DIAGNOSIS — K047 Periapical abscess without sinus: Secondary | ICD-10-CM | POA: Insufficient documentation

## 2021-10-26 LAB — BASIC METABOLIC PANEL
Anion gap: 4 — ABNORMAL LOW (ref 5–15)
BUN: 12 mg/dL (ref 6–20)
CO2: 26 mmol/L (ref 22–32)
Calcium: 8.6 mg/dL — ABNORMAL LOW (ref 8.9–10.3)
Chloride: 105 mmol/L (ref 98–111)
Creatinine, Ser: 0.93 mg/dL (ref 0.44–1.00)
GFR, Estimated: >60 mL/min (ref >60)
Glucose, Bld: 99 mg/dL (ref 70–99)
Potassium: 3.2 mmol/L — ABNORMAL LOW (ref 3.5–5.1)
Sodium: 135 mmol/L (ref 135–145)

## 2021-10-26 LAB — CBC WITH DIFFERENTIAL/PLATELET
Abs Immature Granulocytes: 0.07 10*3/uL (ref 0.00–0.07)
Basophils Absolute: 0 10*3/uL (ref 0.0–0.1)
Basophils Relative: 0 %
Eosinophils Absolute: 0.1 10*3/uL (ref 0.0–0.5)
Eosinophils Relative: 1 %
HCT: 38.8 % (ref 36.0–46.0)
Hemoglobin: 12.6 g/dL (ref 12.0–15.0)
Immature Granulocytes: 1 %
Lymphocytes Relative: 22 %
Lymphs Abs: 2.7 10*3/uL (ref 0.7–4.0)
MCH: 28.5 pg (ref 26.0–34.0)
MCHC: 32.5 g/dL (ref 30.0–36.0)
MCV: 87.8 fL (ref 80.0–100.0)
Monocytes Absolute: 1.1 10*3/uL — ABNORMAL HIGH (ref 0.1–1.0)
Monocytes Relative: 9 %
Neutro Abs: 8.2 10*3/uL — ABNORMAL HIGH (ref 1.7–7.7)
Neutrophils Relative %: 67 %
Platelets: 260 10*3/uL (ref 150–400)
RBC: 4.42 MIL/uL (ref 3.87–5.11)
RDW: 14.4 % (ref 11.5–15.5)
WBC: 12.2 10*3/uL — ABNORMAL HIGH (ref 4.0–10.5)
nRBC: 0 % (ref 0.0–0.2)

## 2021-10-26 MED ORDER — PREDNISONE 10 MG PO TABS: ORAL_TABLET | ORAL | 0 refills | Status: DC

## 2021-10-26 MED ORDER — OXYCODONE-ACETAMINOPHEN 5-325 MG PO TABS: 1.0000 | ORAL_TABLET | Freq: Four times a day (QID) | ORAL | 0 refills | Status: DC | PRN

## 2021-10-26 MED ORDER — CLINDAMYCIN HCL 300 MG PO CAPS: 300.0000 mg | ORAL_CAPSULE | Freq: Three times a day (TID) | ORAL | 0 refills | Status: AC

## 2021-10-26 MED ORDER — CLINDAMYCIN PHOSPHATE 600 MG/50ML IV SOLN
600.0000 mg | Freq: Once | INTRAVENOUS | Status: AC
Start: 2021-10-26 — End: 2021-10-26
  Administered 2021-10-26: 600 mg via INTRAVENOUS
  Filled 2021-10-26: qty 50

## 2021-10-26 NOTE — ED Notes (Signed)
RN notified [place] [employer] dispatch that pt has possibly left with IV in her arm. Per [employer] dispatch they will send a officer to her house. ?

## 2021-10-26 NOTE — ED Notes (Signed)
Pt resting comfortably in bed, NAD. No needs verbalized at this time. Lights dimmed per request for comfort. Bed low & locked; call light & personal items within reach. ?

## 2021-10-26 NOTE — ED Notes (Signed)
RN called [employer] dispatch and they were able to get in touch with pt. Per dispatch, pt took IV out at home. RN notified dispatch that an officer needs to go verify that pt IV has been removed. ?

## 2021-10-26 NOTE — ED Provider Notes (Signed)
? ?[facility] ?Provider Note ? ? ? Event Date/Time  ? First MD Initiated Contact with Patient 10/26/21 1203   ?  (approximate) ? ? ?History  ? ?Oral Swelling ? ? ?HPI ? ?Molly Fernandez is a 46 y.o. female   presents to the ED with complaint of right facial pain that started yesterday but is worse today.  Patient states that she has had chills but has not been aware of any fever.  She currently is taking blood pressure medication for her hypertension.  In addition to hypertension patient has a history of migraines and is status post left wrist lumpectomy 1996.  Patient states that the pain from her gums and cheek may be causing her blood pressure to elevate.  She rates her pain as 10/10. ? ?  ? ? ?Physical Exam  ? ?Triage Vital Signs: ?ED Triage Vitals  ?Enc Vitals Group  ?   BP 10/26/21 1159 (!) 178/111  ?   Pulse Rate 10/26/21 1157 85  ?   Resp 10/26/21 1157 17  ?   Temp 10/26/21 1157 99.3 ?F (37.4 ?C)  ?   Temp Source 10/26/21 1157 Oral  ?   SpO2 10/26/21 1157 96 %  ?   Weight 10/26/21 1200 250 lb (113.4 kg)  ?   Height 10/26/21 1200 5\' 3"  (1.6 m)  ?   Head Circumference --   ?   Peak Flow --   ?   Pain Score 10/26/21 1200 10  ?   Pain Loc --   ?   Pain Edu? --   ?   Excl. in [place]? --   ? ? ?Most recent vital signs: ?Vitals:  ? 10/26/21 1159 10/26/21 1356  ?BP: (!) 178/111 (!) 180/112  ?Pulse:  83  ?Resp:  18  ?Temp:    ?SpO2:  99%  ? ? ? ?General: Awake, no distress.  ?CV:  Good peripheral perfusion.  Heart regular rate and rhythm. ?Resp:  Normal effort.  Lungs are clear bilaterally. ?Abd:  No distention.  ?Other:  Right lower facial edema with marked tenderness to palpation.  Gums are markedly edematous and teeth are in very poor repair and hygiene.  Difficult to assess the lateral aspect of the gums due to pain increase with evaluation with a tongue blade. ? ? ?ED Results / Procedures / Treatments  ? ?Labs ?(all labs ordered are listed, but only abnormal results are displayed) ?Labs  Reviewed  ?CBC WITH DIFFERENTIAL/PLATELET - Abnormal; Notable for the following components:  ?    Result Value  ? WBC 12.2 (*)   ? Neutro Abs 8.2 (*)   ? Monocytes Absolute 1.1 (*)   ? All other components within normal limits  ?BASIC METABOLIC PANEL - Abnormal; Notable for the following components:  ? Potassium 3.2 (*)   ? Calcium 8.6 (*)   ? Anion gap 4 (*)   ? All other components within normal limits  ? ? ? ? ?PROCEDURES: ? ?Critical Care performed:  ? ?Procedures ? ? ?MEDICATIONS ORDERED IN ED: ?Medications  ?clindamycin (CLEOCIN) IVPB 600 mg (600 mg Intravenous New Bag/Given 10/26/21 1249)  ? ? ? ?IMPRESSION / MDM / ASSESSMENT AND PLAN / ED COURSE  ?I reviewed the triage vital signs and the nursing notes. ? ? ?Differential diagnosis includes, but is not limited to, Dental abscess, cellulitis, gingivitis. ? ?46 year old female presents to the ED with complaint of right lower facial edema that started slowly yesterday but is much  larger today.  On exam teeth on the right lower mandible are in poor repair but gums are edematous and markedly tender to palpation with a tongue depressor.  Lab work was elevated with a WBC of 12,200 and potassium was slightly low at 3.2.  Patient does have a history of hypertension and did take her blood pressure medication today but states that the pain most likely is was causing her blood pressure to elevate.  While in the emergency department her blood pressure was 178/111.  Patient is strongly encouraged to follow-up with a dentist and a list of clinics is on her discharge papers.  Also follow-up with her PCP to have her blood pressure rechecked and continue taking her blood pressure medication. ? ? ? ?FINAL CLINICAL IMPRESSION(S) / ED DIAGNOSES  ? ?Final diagnoses:  ?Dental abscess  ? ? ? ?Rx / DC Orders  ? ?ED Discharge Orders   ? ?      Ordered  ?  clindamycin (CLEOCIN) 300 MG capsule  3 times daily       ? 10/26/21 1327  ?  predniSONE (DELTASONE) 10 MG tablet       ? 10/26/21  1327  ?  oxyCODONE-acetaminophen (PERCOCET) 5-325 MG tablet  Every 6 hours PRN       ? 10/26/21 1327  ? ?  ?  ? ?  ? ? ? ?Note:  This document was prepared using Dragon voice recognition software and may include unintentional dictation errors. ?  ?Johnn Hai, PA-C ?10/26/21 1450 ? ?  ?Lavonia Drafts, MD ?10/26/21 1451 ? ?

## 2021-10-26 NOTE — ED Notes (Addendum)
Pt called back to ED and sts that she was in so much pain she just needed to go home and take tylenol. RN advised pt that her d/c papers are here in the ED for pickup and that should could come and get them if she wanted to. RN verified name and DOB with pt, RN explained to pt that she has Rx waiting for her at the pharmacy and pt verbalized understanding of them over the phone. RN asked pt about taking out her IV. Pt sts that the officer just left her house and instructed her to call back and that in fact she did take the IV out of her right arm. ?

## 2021-10-26 NOTE — Discharge Instructions (Addendum)
3 prescriptions were sent to your pharmacy.  The clindamycin is for infection and prednisone is to decrease swelling.  The oxycodone is for severe pain if needed.  Be aware that you cannot take this medication and drive or operate machinery as it could cause drowsiness.  Continue taking your blood pressure medication and follow-up with your primary care provider for recheck of your blood pressure was elevated in the ED at 178/111.  A list of dental clinics is on your discharge papers.  The dental clinic at [facility] also has walk-in hours that you can call and see what hours you can walk-in without an appointment. ? ?OPTIONS FOR DENTAL FOLLOW UP CARE ? ?[place] [facility] and [facility] - Local Safety Net Dental Clinics ?[email or web address] ?  ?[facility] [phone]) ? ?[facility] [phone]) ? ?[facility] [phone] ext [phone]) ? ?[facility] [phone]) ? ?[facility] [phone]) ?This clinic caters to the indigent population and is on a lottery system. ?Location: ?[employer] [facility], [employer], [address], [place] ?Clinic Hours: ?Wednesdays from 6pm - 9pm, patients seen by a lottery system. ?For dates, call or go to [email or web address] ?Services: ?Cleanings, fillings and simple extractions. ?Payment Options: ?DENTAL WORK IS FREE OF CHARGE. Bring proof of income or support. ?Best way to get seen: ?Arrive at 5:15 pm - this is a lottery, NOT first come/first serve, so arriving earlier will not increase your chances of being seen. ?  ?  ?[facility] ?[phone] ?Select option 1 for emergencies ?  ?Location: ?[employer] [facility] [facility], [employer], [address], [place] ?Clinic Hours: ?No walk-ins accepted - call the day before to schedule an appointment. ?Check in times are 9:30 am and 1:30  pm. ?Services: ?Simple extractions, temporary fillings, pulpectomy/pulp debridement, uncomplicated abscess drainage. ?Payment Options: ?PAYMENT IS DUE AT THE TIME OF SERVICE.  Fee is usually $100-200, additional surgical procedures (e.g. abscess drainage) may be extra. ?Cash, checks, Visa/MasterCard accepted.  Can file Medicaid if patient is covered for dental - patient should call case worker to check. ?No discount for [facility] patients. ?Best way to get seen: ?MUST call the day before and get onto the schedule. Can usually be seen the next 1-2 days. No walk-ins accepted. ?  ?  ?[facility] ?[phone] ?  ?Location: ?[facility], [address] [place] ?Clinic Hours: ?M, W, Th, F 8am or 1:30pm, Tues 9a or 1:30 - first come/first served. ?Services: ?Simple extractions, temporary fillings, uncomplicated abscess drainage.  You do not need to be an [place] resident. ?Payment Options: ?PAYMENT IS DUE AT THE TIME OF SERVICE. ?Dental insurance, otherwise sliding scale - bring proof of income or support. ?Depending on income and treatment needed, cost is usually $50-200. ?Best way to get seen: ?Arrive early as it is first come/first served. ?  ?  ?[facility] ?[phone] ?  ?Location: ?[address] ?Clinic Hours: ?Mon-Thu 8a-5p ?Services: ?Most basic dental services including extractions and fillings. ?Payment Options: ?PAYMENT IS DUE AT THE TIME OF SERVICE. ?Sliding scale, up to 50% off - bring proof if income or support. ?Medicaid with dental option accepted. ?Best way to get seen: ?Call to schedule an appointment, can usually be seen within 2 weeks OR they will try to see walk-ins - show up at 8a or 2p (you may have to wait). ?  ?  ?[facility] ?[phone] ?  [place] RESIDENTS ONLY ?  ?Location: ?[facility] [employer], [address], [place], [place] [address] ?Clinic Hours: By appointment  only. ?Monday - Thursday 8am-5pm, Friday 8am-12pm ?Services: Cleanings, fillings, extractions. ?Payment Options: ?PAYMENT IS DUE AT THE TIME OF SERVICE. ?Cash, Visa or MasterCard. Sliding scale - $30 minimum per service. ?Best way to get seen: ?Come in to office, complete packet and make an appointment - need proof of income ?or support monies for each household member and proof of [facility] residence. ?Usually takes about a month to get in. ?  ?  ?[facility] ?[phone] ?  ?Location: ?[address] [facility] ?Clinic Hours: Walk-in Urgent Care Dental Services are offered Monday-Friday mornings only. ?The numbers of emergencies accepted daily is limited to the number of ?providers available. ?Maximum 15 - Mondays, Wednesdays & Thursdays ?Maximum 10 - Tuesdays & Fridays ?Services: ?You do not need to be a [facility] resident to be seen for a dental emergency. ?Emergencies are defined as pain, swelling, abnormal bleeding, or dental trauma. Walkins will receive x-rays if needed. ?NOTE: Dental cleaning is not an emergency. ?Payment Options: ?PAYMENT IS DUE AT THE TIME OF SERVICE. ?Minimum co-pay is $40.00 for uninsured patients. ?Minimum co-pay is $3.00 for Medicaid with dental coverage. ?Dental Insurance is accepted and must be presented at time of visit. ?Medicare does not cover dental. ?Forms of payment: Cash, credit card, checks. ?Best way to get seen: ?If not previously registered with the clinic, walk-in dental registration begins at 7:15 am and is on a first come/first serve basis. ?If previously registered with the clinic, call to make an appointment. ?  ?  ?[facility] ?[phone] ?[place] RESIDENTS ONLY ?  ?Location: ?[address], [place], [place] ?Clinic Hours: ?Mon-Thu 10a-2p ?Services: Extractions only! ?Payment Options: ?FREE (donations accepted) - bring proof of income or support ?Best way to get seen: ?Call and schedule an appointment OR come  at 8am on the 1st Monday of every month (except for holidays) when it is first come/first served. ?  ?  ?[facility] ?[phone] ?  ?Location: ?[address], [place] ?Clinic Hours: ?Friday mornings ?Services, Payment Options, Best way to get seen: ?Call for info  ?

## 2021-10-26 NOTE — ED Triage Notes (Signed)
Pt states she has had dental pain for a couple days and today woke up with the bottom R side of her jaw swollen ?

## 2021-10-26 NOTE — ED Notes (Signed)
RN unable to find pt. RN checked lobby and bathrooms in the dept. RN called pt and RN identified one self and pt hung up on RN. RN attempted to call pt back to ask about IV placement. RN did not find IV in room or trash can. ?

## 2022-02-05 ENCOUNTER — Emergency Department
Admission: EM | Admit: 2022-02-05 | Discharge: 2022-02-05 | Disposition: A | Discharge status: Home / Self Care | Payer: Self-pay | Attending: Emergency Medicine | Admitting: Emergency Medicine

## 2022-02-05 ENCOUNTER — Other Ambulatory Visit: Payer: Self-pay

## 2022-02-05 ENCOUNTER — Emergency Department: Payer: Self-pay

## 2022-02-05 DIAGNOSIS — K029 Dental caries, unspecified: Secondary | ICD-10-CM | POA: Insufficient documentation

## 2022-02-05 DIAGNOSIS — I10 Essential (primary) hypertension: Secondary | ICD-10-CM | POA: Insufficient documentation

## 2022-02-05 LAB — CBC
HCT: 44.9 % (ref 36.0–46.0)
Hemoglobin: 14.5 g/dL (ref 12.0–15.0)
MCH: 27.6 pg (ref 26.0–34.0)
MCHC: 32.3 g/dL (ref 30.0–36.0)
MCV: 85.4 fL (ref 80.0–100.0)
Platelets: 337 10*3/uL (ref 150–400)
RBC: 5.26 MIL/uL — ABNORMAL HIGH (ref 3.87–5.11)
RDW: 14.3 % (ref 11.5–15.5)
WBC: 10.2 10*3/uL (ref 4.0–10.5)
nRBC: 0 % (ref 0.0–0.2)

## 2022-02-05 LAB — BASIC METABOLIC PANEL
Anion gap: 8 (ref 5–15)
BUN: 10 mg/dL (ref 6–20)
CO2: 21 mmol/L — ABNORMAL LOW (ref 22–32)
Calcium: 9.3 mg/dL (ref 8.9–10.3)
Chloride: 104 mmol/L (ref 98–111)
Creatinine, Ser: 0.7 mg/dL (ref 0.44–1.00)
GFR, Estimated: >60 mL/min (ref >60)
Glucose, Bld: 94 mg/dL (ref 70–99)
Potassium: 3.4 mmol/L — ABNORMAL LOW (ref 3.5–5.1)
Sodium: 133 mmol/L — ABNORMAL LOW (ref 135–145)

## 2022-02-05 LAB — TROPONIN I (HIGH SENSITIVITY)
Troponin I (High Sensitivity): 5 ng/L (ref <18)
Troponin I (High Sensitivity): 7 ng/L (ref <18)

## 2022-02-05 LAB — POC URINE PREG, ED: Preg Test, Ur: NEGATIVE

## 2022-02-05 MED ORDER — KETOROLAC TROMETHAMINE 30 MG/ML IJ SOLN
30.0000 mg | Freq: Once | INTRAMUSCULAR | Status: AC
Start: 2022-02-05 — End: 2022-02-05
  Administered 2022-02-05: 30 mg via INTRAVENOUS
  Filled 2022-02-05: qty 1

## 2022-02-05 MED ORDER — AMLODIPINE BESYLATE 5 MG PO TABS
5.0000 mg | ORAL_TABLET | Freq: Every day | ORAL | 5 refills | Status: AC
Start: 2022-02-05 — End: 2022-08-04

## 2022-02-05 MED ORDER — OXYCODONE-ACETAMINOPHEN 5-325 MG PO TABS
1.0000 | ORAL_TABLET | Freq: Once | ORAL | Status: AC
  Administered 2022-02-05: 1 via ORAL
  Filled 2022-02-05: qty 1

## 2022-02-05 MED ORDER — CLINDAMYCIN HCL 300 MG PO CAPS: 300.0000 mg | ORAL_CAPSULE | Freq: Three times a day (TID) | ORAL | 0 refills | Status: AC

## 2022-02-05 MED ORDER — IOHEXOL 300 MG/ML  SOLN
75.0000 mL | Freq: Once | INTRAMUSCULAR | Status: AC | PRN
  Administered 2022-02-05: 75 mL via INTRAVENOUS

## 2022-02-05 MED ORDER — CLINDAMYCIN HCL 150 MG PO CAPS
300.0000 mg | ORAL_CAPSULE | Freq: Once | ORAL | Status: AC
Start: 2022-02-05 — End: 2022-02-05
  Administered 2022-02-05: 300 mg via ORAL
  Filled 2022-02-05: qty 2

## 2022-02-05 NOTE — ED Provider Notes (Signed)
[facility] Emergency Department Provider Note     Event Date/Time   First MD Initiated Contact with Patient 02/05/22 1508     (approximate)   History   Facial Pain and facial numbness   HPI  Molly Fernandez is a 46 y.o. female with a history with a history of hypertension and migraine, presents to the ED with 2 days of left-sided jaw pain.  Patient reports pain to the right lower jaw initially treated with topical analgesics for dental pain.  She reports no recent dental procedures and no untreated cavities at this time.  She has noted that has been difficult to wear her dentures secondary to the pain.  She denies any spontaneous purulent drainage, difficulty breathing, swallowing, or controlling oral secretions.  Patient also denies any fevers but does endorse some chills.  She describes the pain in the jaw is sharp and shooting, referring to the left ear as well as down the left neck.  She denies any frank chest pain but localizes pain to the left subclavicular region.  Patient would also describe some mild facial paresthesias but denies any outright facial weakness, facial droop, slurred speech, or ptosis.     Physical Exam   Triage Vital Signs: ED Triage Vitals  Enc Vitals Group     BP 02/05/22 1228 (!) 187/139     Pulse Rate 02/05/22 1228 (!) 105     Resp 02/05/22 1228 20     Temp 02/05/22 1228 98.7 F (37.1 C)     Temp Source 02/05/22 1228 Oral     SpO2 02/05/22 1228 99 %     Weight 02/05/22 1230 202 lb (91.6 kg)     Height 02/05/22 1230 5\' 3"  (1.6 m)     Head Circumference --      Peak Flow --      Pain Score 02/05/22 1230 10     Pain Loc --      Pain Edu? --      Excl. in GC? --     Most recent vital signs: Vitals:   02/05/22 1558 02/05/22 1708  BP: (!) 214/139 (!) 180/121  Pulse:  98  Resp:  16  Temp:  98.4 F (36.9 C)  SpO2:  100%    General Awake, no distress. NAD HEENT NCAT. PERRL. EOMI. No rhinorrhea. Mucous membranes  are moist. Uvula is midline and tonsils are flat. Tenderness localized to the left mandible and submandibular space adjacent to 1st/2nd molars. No sublingual edema or erythema noted. Palpable left submandibular space cystic lesion. Tender, but mobile and without hard or fixed position.  CV:  Good peripheral perfusion.  RESP:  Normal effort. Subjective tenderness to left upper pectoralis musculature ABD:  No distention.    ED Results / Procedures / Treatments   Labs (all labs ordered are listed, but only abnormal results are displayed) Labs Reviewed  BASIC METABOLIC PANEL - Abnormal; Notable for the following components:      Result Value   Sodium 133 (*)    Potassium 3.4 (*)    CO2 21 (*)    All other components within normal limits  CBC - Abnormal; Notable for the following components:   RBC 5.26 (*)    All other components within normal limits  POC URINE PREG, ED  TROPONIN I (HIGH SENSITIVITY)  TROPONIN I (HIGH SENSITIVITY)     EKG  Vent. rate 104 BPM PR interval 158 ms QRS duration 74 ms QT/QTcB 334/439  ms P-R-T axes 53 35 15 Sinus tachy rate No STEMI  RADIOLOGY  I personally viewed and evaluated these images as part of my medical decision making, as well as reviewing the written report by the radiologist.  ED Provider Interpretation: no acute findings}  CT Soft Tissue Neck W Contrast  Result Date: 02/05/2022 CLINICAL DATA:  Soft tissue swelling, infection suspected; left submandibular swelling EXAM: CT NECK WITH CONTRAST TECHNIQUE: Multidetector CT imaging of the neck was performed using the standard protocol following the bolus administration of intravenous contrast. RADIATION DOSE REDUCTION: This exam was performed according to the departmental dose-optimization program which includes automated exposure control, adjustment of the mA and/or kV according to patient size and/or use of iterative reconstruction technique. CONTRAST:  75mL OMNIPAQUE IOHEXOL 300 MG/ML  SOLN  COMPARISON:  None Available. FINDINGS: Pharynx and larynx: Unremarkable.  No mass or swelling. Salivary glands: Parotid and submandibular glands are unremarkable. Thyroid: Prominent but otherwise unremarkable. Lymph nodes: No enlarged or abnormal density nodes. Vascular: Major neck vessels are patent. Limited intracranial: No abnormal enhancement. Visualized orbits: Unremarkable. Mastoids and visualized paranasal sinuses: Aerated. Skeleton: No acute abnormality. Upper chest: Included upper lungs are clear. Other: Multifocal tooth decay. IMPRESSION: No neck mass, adenopathy, or significant inflammatory changes. Numerous decayed teeth. Electronically Signed   By: Guadlupe SpanishPraneil  Patel M.D.   On: 02/05/2022 16:26     PROCEDURES:  Critical Care performed: No  Procedures   MEDICATIONS ORDERED IN ED: Medications  oxyCODONE-acetaminophen (PERCOCET/ROXICET) 5-325 MG per tablet 1 tablet (1 tablet Oral Given 02/05/22 1324)  ketorolac (TORADOL) 30 MG/ML injection 30 mg (30 mg Intravenous Given 02/05/22 1547)  iohexol (OMNIPAQUE) 300 MG/ML solution 75 mL (75 mLs Intravenous Contrast Given 02/05/22 1602)  clindamycin (CLEOCIN) capsule 300 mg (300 mg Oral Given 02/05/22 1705)     IMPRESSION / MDM / ASSESSMENT AND PLAN / ED COURSE  I reviewed the triage vital signs and the nursing notes.                              Differential diagnosis includes, but is not limited to, dental infection, soft tissue neck abscess, malignancy   Patient's presentation is most consistent with acute complicated illness / injury requiring diagnostic workup.  Patient with a reassuring work-up at this time without signs of acute infectious process, critical anemia, electrolyte abnormality.  No evidence of a focal abscess or space-occupying airway lesion.  Patient is stable at this time without indication of acute coronary syndrome or other concerning neurologic findings.  Patient's diagnosis is consistent with likely local dental  infection without focal abscess. Patient will be discharged home with prescriptions for clindamycin.  Patient is also given a prescription of amlodipine as her blood pressure appears to be poorly controlled, based on her report with lisinopril/HCTZ.  Patient is to follow up with local primary care clinic as needed or otherwise directed. Patient is given ED precautions to return to the ED for any worsening or new symptoms.     FINAL CLINICAL IMPRESSION(S) / ED DIAGNOSES   Final diagnoses:  Pain due to dental caries  Poor high blood pressure control     Rx / DC Orders   ED Discharge Orders          Ordered    clindamycin (CLEOCIN) 300 MG capsule  3 times daily        02/05/22 1642    amLODipine (NORVASC) 5 MG tablet  Daily  02/05/22 1642             Note:  This document was prepared using Dragon voice recognition software and may include unintentional dictation errors.    Lissa Hoard, PA-C 02/06/22 0018    Gilles Chiquito, MD 02/06/22 1000

## 2022-02-05 NOTE — Discharge Instructions (Addendum)
Your exam, labs, and CT scan are normal at this time. You are being treated with an antibiotic. You should continue to monitor and treat your blood pressure with the new prescription. Follow-up with one of the local clinics for ongoing BP management.

## 2022-02-05 NOTE — ED Triage Notes (Signed)
Pt to ED POV with daughter, pt crying and rocking at this time, states has intense pain to L face and down L arm, also pain under L jaw since 2 days ago. Denies untreated caries but states has dentures and is painful to wear them. Pain is sharp and shooting.  Denies recent injuries. Endorses L side chest pain, EKG performed already.  Also L facial numbness since 0600 that radiates to L neck area and down L arm.    Pt also appears diaphoretic.

## 2022-02-05 NOTE — ED Notes (Signed)
Wrote to provider for pain med orders. Pt in severe pain and allergic to hydromorphone.
